# Patient Record
Sex: Female | Born: 1980 | Race: Black or African American | Hispanic: No | Marital: Married | Smoking: Never smoker
Health system: Southern US, Community
[De-identification: ages and names within clinical notes are randomized; demographics above are authoritative.]

## PROBLEM LIST (undated history)

## (undated) DIAGNOSIS — D649 Anemia, unspecified: Secondary | ICD-10-CM

## (undated) HISTORY — PX: TUBAL LIGATION: SHX77

---

## 2006-04-03 ENCOUNTER — Emergency Department (HOSPITAL_COMMUNITY): Admission: EM | Admit: 2006-04-03 | Discharge: 2006-04-04 | Payer: Self-pay | Admitting: Emergency Medicine

## 2007-04-23 ENCOUNTER — Emergency Department (HOSPITAL_COMMUNITY): Admission: EM | Admit: 2007-04-23 | Discharge: 2007-04-23 | Payer: Self-pay | Admitting: Emergency Medicine

## 2007-06-25 ENCOUNTER — Emergency Department (HOSPITAL_COMMUNITY): Admission: EM | Admit: 2007-06-25 | Discharge: 2007-06-25 | Payer: Self-pay | Admitting: Emergency Medicine

## 2007-07-13 ENCOUNTER — Ambulatory Visit (HOSPITAL_COMMUNITY): Admission: RE | Admit: 2007-07-13 | Discharge: 2007-07-13 | Payer: Self-pay | Admitting: Obstetrics & Gynecology

## 2007-07-21 ENCOUNTER — Ambulatory Visit (HOSPITAL_COMMUNITY): Admission: RE | Admit: 2007-07-21 | Discharge: 2007-07-21 | Payer: Self-pay | Admitting: Obstetrics & Gynecology

## 2007-08-07 ENCOUNTER — Ambulatory Visit: Payer: Self-pay | Admitting: *Deleted

## 2007-08-07 ENCOUNTER — Inpatient Hospital Stay (HOSPITAL_COMMUNITY): Admission: AD | Admit: 2007-08-07 | Discharge: 2007-08-07 | Payer: Self-pay | Admitting: Obstetrics & Gynecology

## 2007-08-11 ENCOUNTER — Ambulatory Visit (HOSPITAL_COMMUNITY): Admission: RE | Admit: 2007-08-11 | Discharge: 2007-08-11 | Payer: Self-pay | Admitting: Family Medicine

## 2007-08-28 ENCOUNTER — Inpatient Hospital Stay (HOSPITAL_COMMUNITY): Admission: AD | Admit: 2007-08-28 | Discharge: 2007-08-28 | Payer: Self-pay | Admitting: Obstetrics & Gynecology

## 2007-08-28 ENCOUNTER — Ambulatory Visit: Payer: Self-pay | Admitting: *Deleted

## 2007-10-13 ENCOUNTER — Ambulatory Visit (HOSPITAL_COMMUNITY): Admission: RE | Admit: 2007-10-13 | Discharge: 2007-10-13 | Payer: Self-pay | Admitting: Obstetrics & Gynecology

## 2007-10-26 ENCOUNTER — Inpatient Hospital Stay (HOSPITAL_COMMUNITY): Admission: AD | Admit: 2007-10-26 | Discharge: 2007-10-26 | Payer: Self-pay | Admitting: Obstetrics & Gynecology

## 2007-10-26 ENCOUNTER — Ambulatory Visit: Payer: Self-pay | Admitting: Obstetrics & Gynecology

## 2007-11-07 ENCOUNTER — Ambulatory Visit: Payer: Self-pay | Admitting: Obstetrics and Gynecology

## 2007-11-07 ENCOUNTER — Inpatient Hospital Stay (HOSPITAL_COMMUNITY): Admission: AD | Admit: 2007-11-07 | Discharge: 2007-11-07 | Payer: Self-pay | Admitting: Obstetrics & Gynecology

## 2007-11-24 ENCOUNTER — Ambulatory Visit (HOSPITAL_COMMUNITY): Admission: RE | Admit: 2007-11-24 | Discharge: 2007-11-24 | Payer: Self-pay | Admitting: Family Medicine

## 2007-12-07 ENCOUNTER — Inpatient Hospital Stay (HOSPITAL_COMMUNITY): Admission: RE | Admit: 2007-12-07 | Discharge: 2007-12-09 | Payer: Self-pay | Admitting: Family Medicine

## 2008-01-12 ENCOUNTER — Inpatient Hospital Stay (HOSPITAL_COMMUNITY): Admission: AD | Admit: 2008-01-12 | Discharge: 2008-01-12 | Payer: Self-pay | Admitting: Obstetrics & Gynecology

## 2008-02-14 ENCOUNTER — Inpatient Hospital Stay (HOSPITAL_COMMUNITY): Admission: AD | Admit: 2008-02-14 | Discharge: 2008-02-14 | Payer: Self-pay | Admitting: Obstetrics and Gynecology

## 2008-03-06 IMAGING — US US OB FOLLOW-UP
1 series · 14 of 28 positions shown · non-contrast
Comparison: none

OBSTETRICAL ULTRASOUND:
 This ultrasound was performed in The [HOSPITAL], and the [facility] report will be stored to [REDACTED] PACS.

[Series 1: us ob follow-up · 44 acquisitions, 14 frames shown]
[im 2/44]
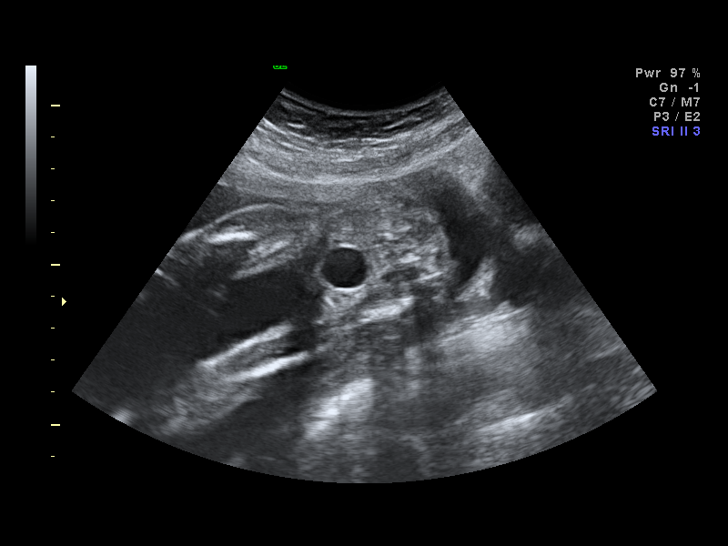
[im 5/44]
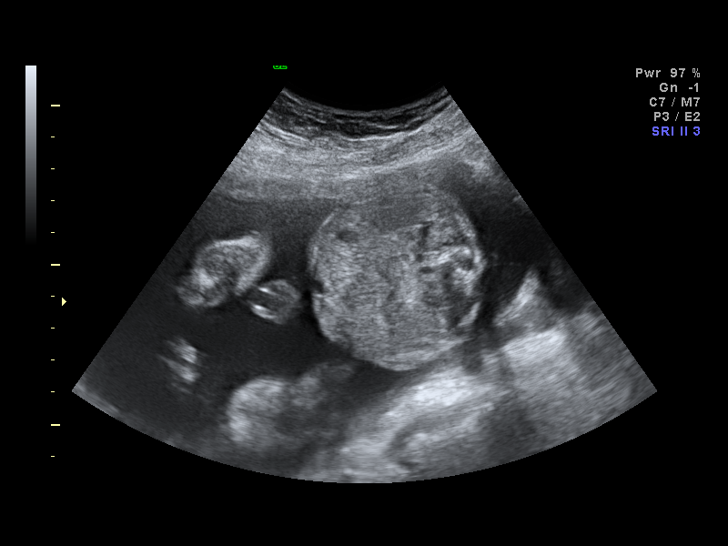
[im 8/44]
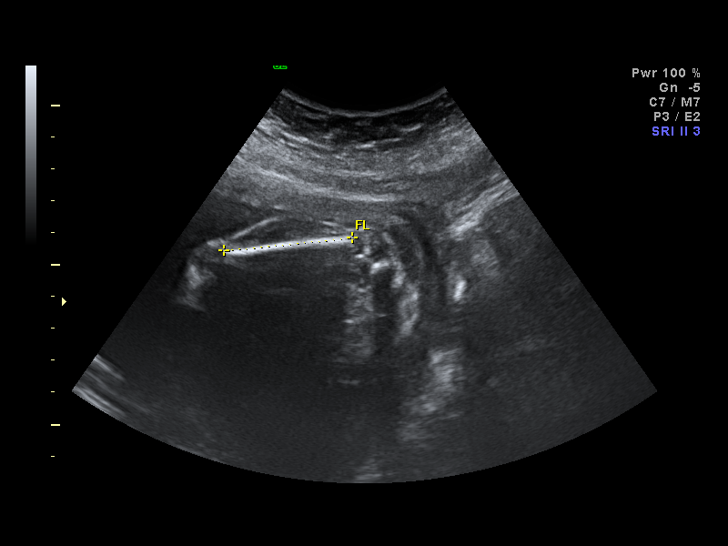
[im 12/44]
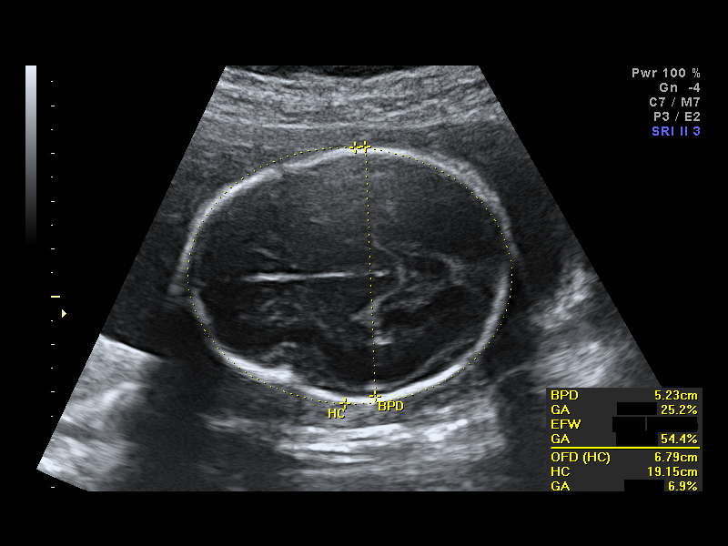
[im 15/44]
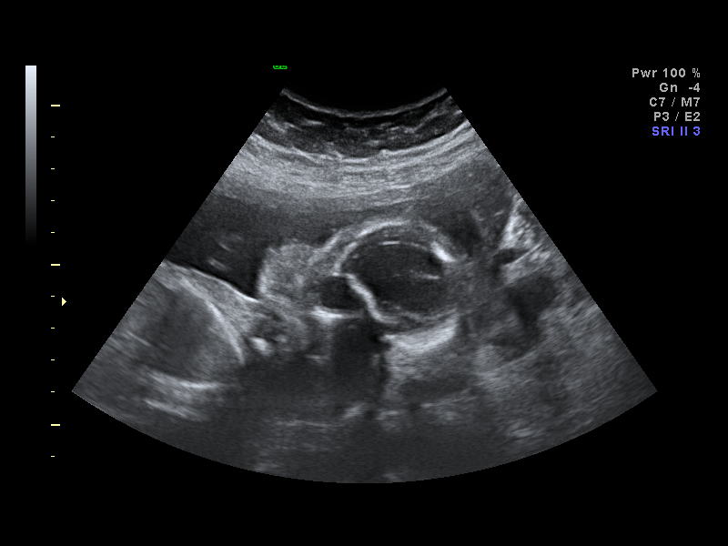
[im 18/44]
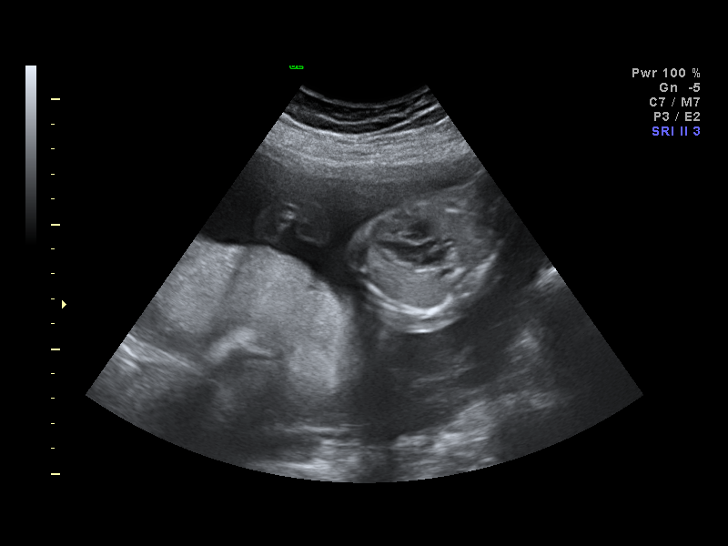
[im 21/44]
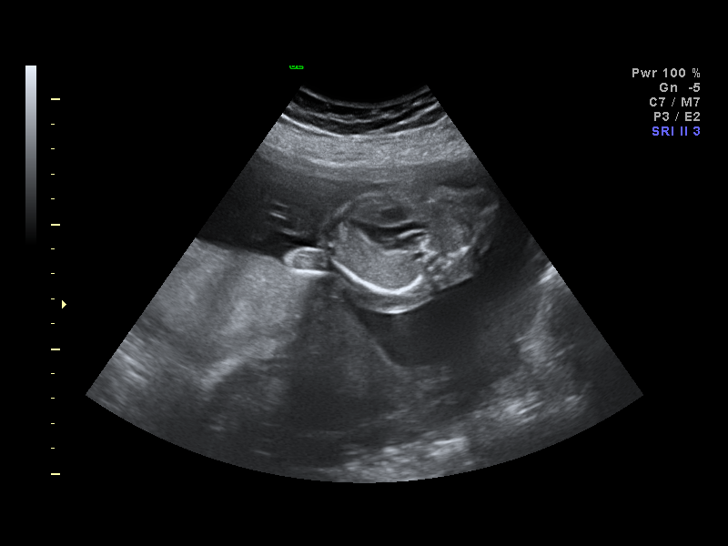
[im 24/44]
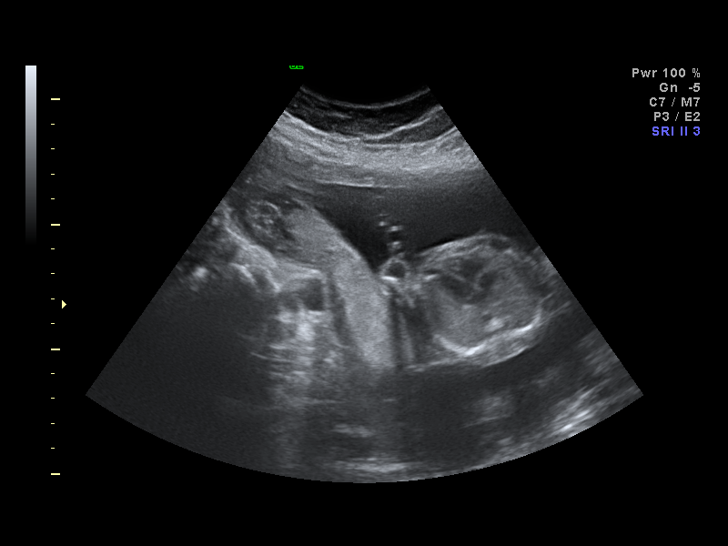
[im 28/44]
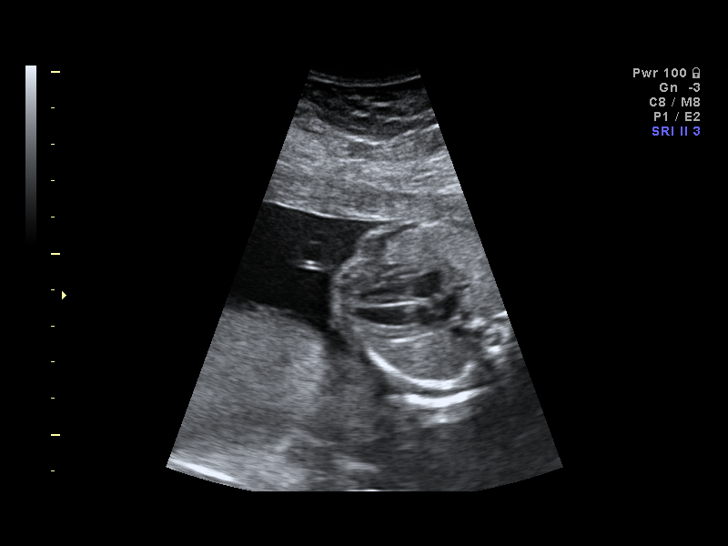
[im 31/44]
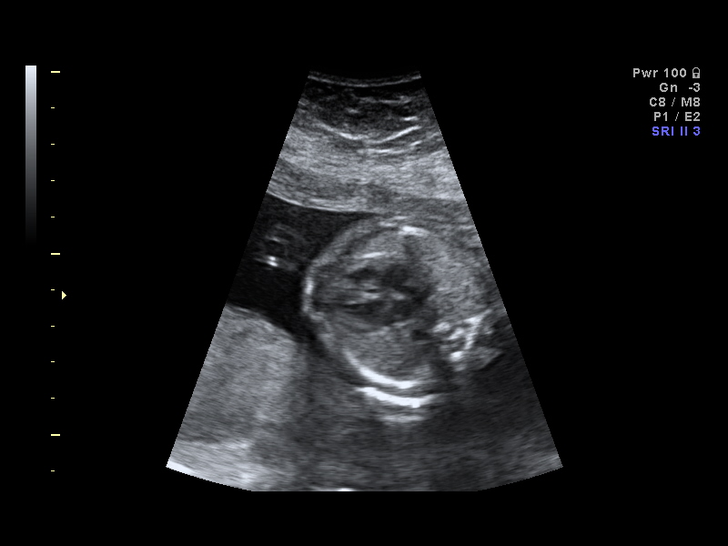
[im 34/44]
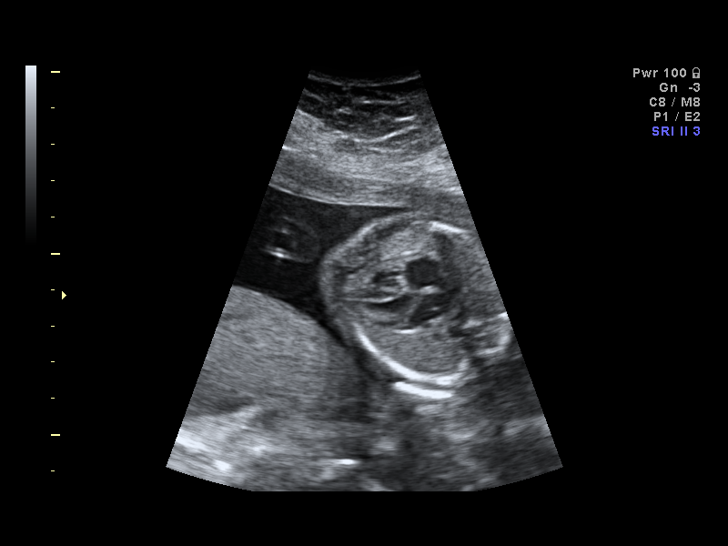
[im 37/44]
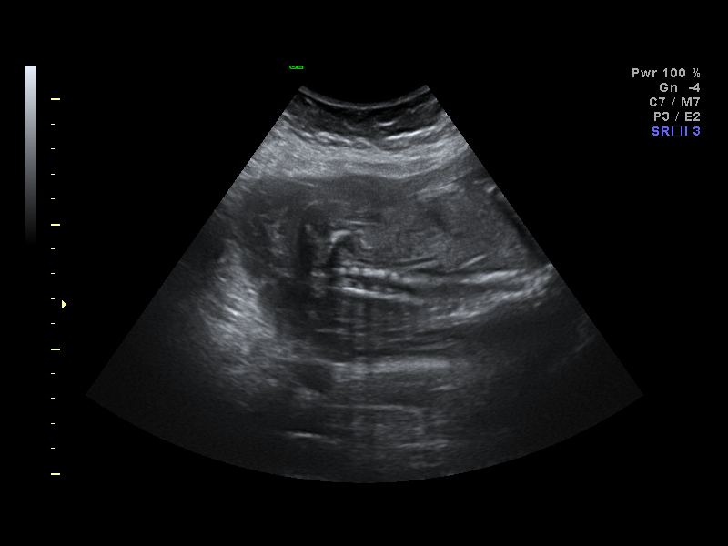
[im 40/44]
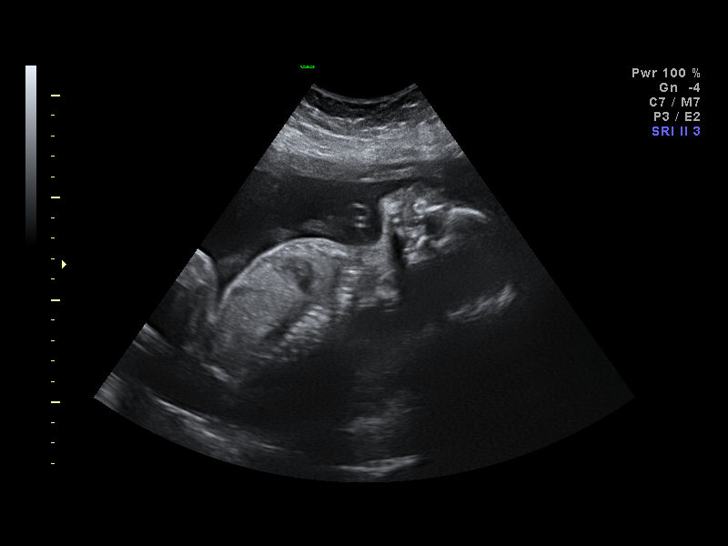
[im 44/44]
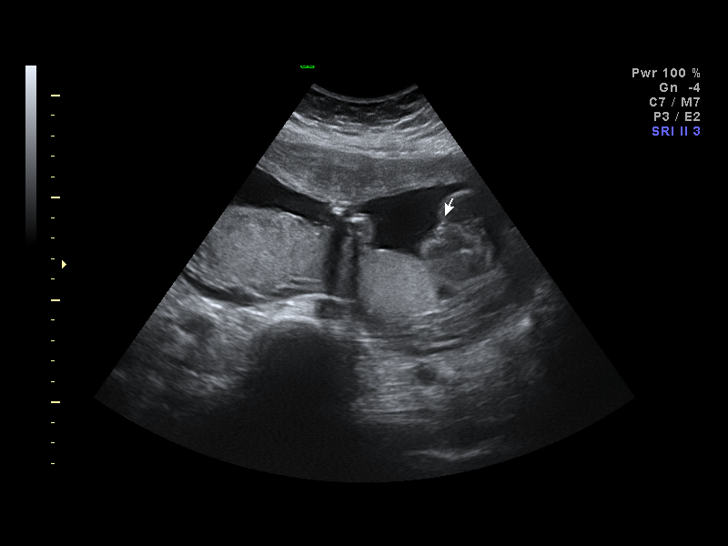

[14 of 28 positions shown; findings below may reference images not displayed]

IMPRESSION: [facility] has also been faxed to the ordering physician.

## 2009-05-17 ENCOUNTER — Ambulatory Visit: Payer: Self-pay | Admitting: Internal Medicine

## 2009-05-17 ENCOUNTER — Inpatient Hospital Stay (HOSPITAL_COMMUNITY)
Admission: EM | Admit: 2009-05-17 | Discharge: 2009-05-19 | Payer: Self-pay | Admitting: Blood Banking & Transfusion Medicine

## 2009-05-18 ENCOUNTER — Ambulatory Visit: Payer: Self-pay | Admitting: Oncology

## 2009-05-19 ENCOUNTER — Encounter (INDEPENDENT_AMBULATORY_CARE_PROVIDER_SITE_OTHER): Payer: Self-pay | Admitting: Internal Medicine

## 2009-05-19 ENCOUNTER — Ambulatory Visit: Payer: Self-pay | Admitting: Oncology

## 2009-06-05 LAB — COMPREHENSIVE METABOLIC PANEL
Albumin: 3.8 g/dL (ref 3.5–5.2)
BUN: 6 mg/dL (ref 6–23)
CO2: 25 mEq/L (ref 19–32)
Calcium: 8.7 mg/dL (ref 8.4–10.5)
Chloride: 104 mEq/L (ref 96–112)
Glucose, Bld: 99 mg/dL (ref 70–99)
Potassium: 3.9 mEq/L (ref 3.5–5.3)
Sodium: 137 mEq/L (ref 135–145)
Total Protein: 7.2 g/dL (ref 6.0–8.3)

## 2009-06-05 LAB — CBC WITH DIFFERENTIAL/PLATELET
Basophils Absolute: 0 10*3/uL (ref 0.0–0.1)
Eosinophils Absolute: 0.2 10*3/uL (ref 0.0–0.5)
HGB: 8 g/dL — ABNORMAL LOW (ref 11.6–15.9)
MONO#: 0.5 10*3/uL (ref 0.1–0.9)
NEUT#: 4.4 10*3/uL (ref 1.5–6.5)
RBC: 3.77 10*6/uL (ref 3.70–5.45)
RDW: 35.6 % — ABNORMAL HIGH (ref 11.2–14.5)
WBC: 6.4 10*3/uL (ref 3.9–10.3)

## 2009-06-05 LAB — IRON AND TIBC: %SAT: 5 % — ABNORMAL LOW (ref 20–55)

## 2009-06-05 LAB — LACTATE DEHYDROGENASE: LDH: 155 U/L (ref 94–250)

## 2009-06-05 LAB — FERRITIN: Ferritin: 14 ng/mL (ref 10–291)

## 2009-12-12 IMAGING — CT CT HEAD W/ CM
1 of 2 series · 13 of 30 positions shown, 17 images · IV contrast (agent unspecified)
Comparison: None

CLINICAL DATA: Anemia, weakness

CT HEAD WITHOUT AND WITH CONTRAST
TECHNIQUE: Contiguous axial images were obtained from the base of
the skull through the vertex with intravenous contrast
Contrast: 100 ml 8mnipaque-P11 IV

[Series 2: head_seq 4.5 h37s st · axial · 0.43mm/px · z∈[-129,+6]mm · 13 of 36 slices shown, 17 images]
[im 3/36  brain]
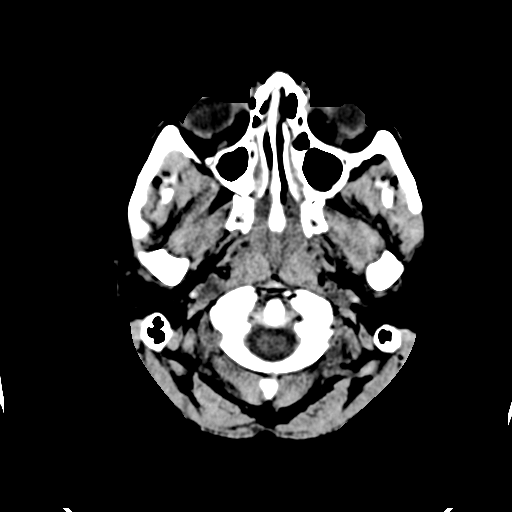
[im 3/36  bone]
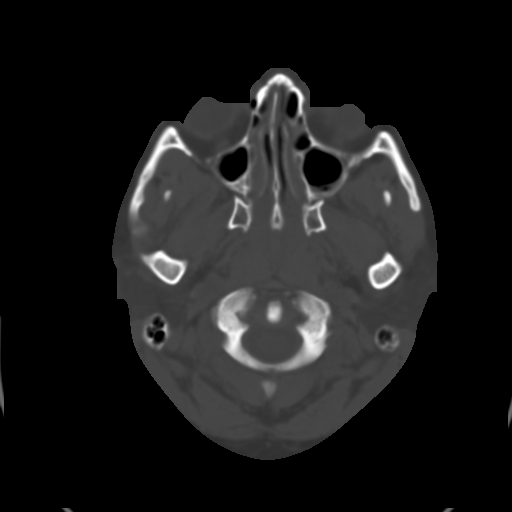
[im 6/36  brain]
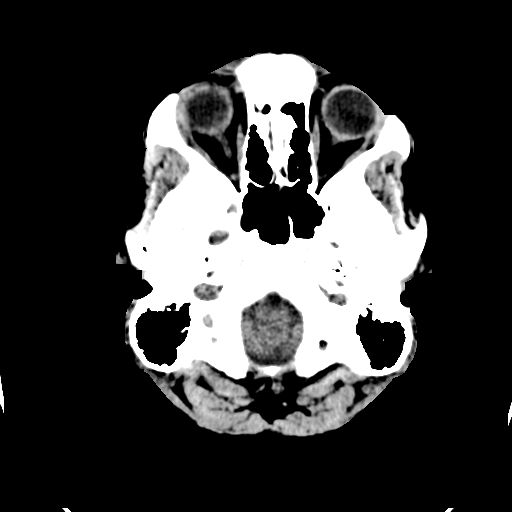
[im 8/36  brain]
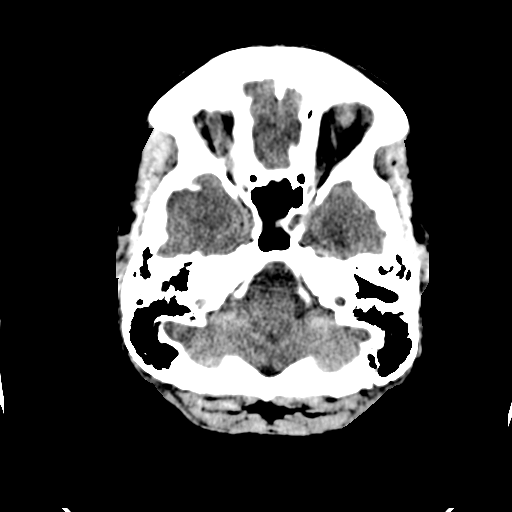
[im 11/36  brain]
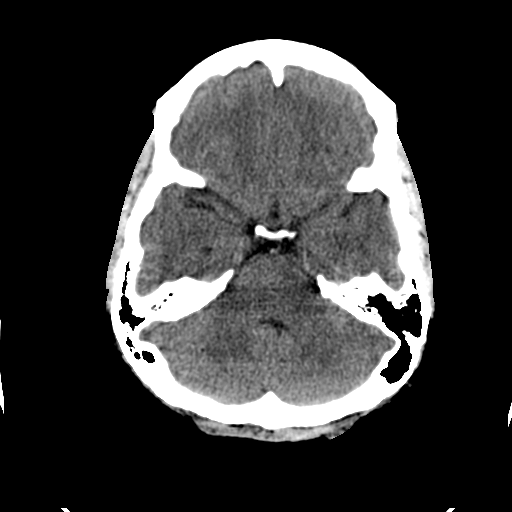
[im 13/36  brain]
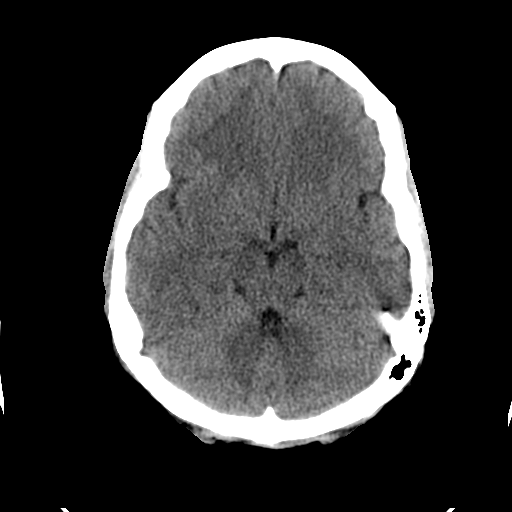
[im 13/36  bone]
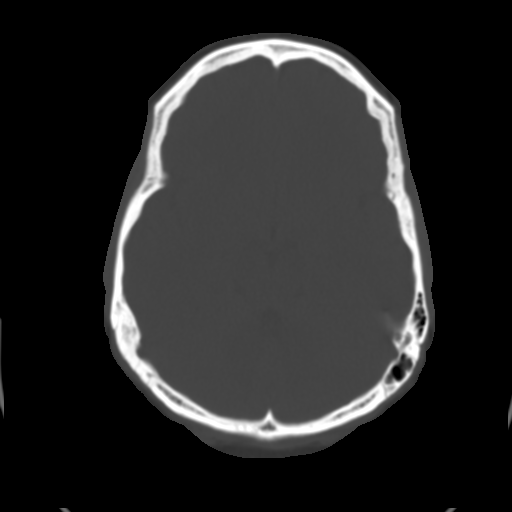
[im 16/36  brain]
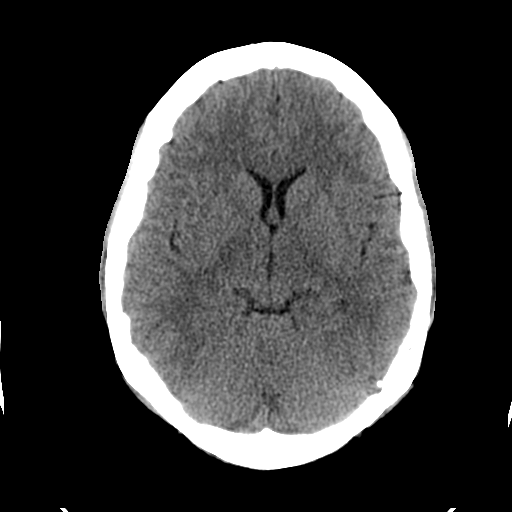
[im 18/36  brain]
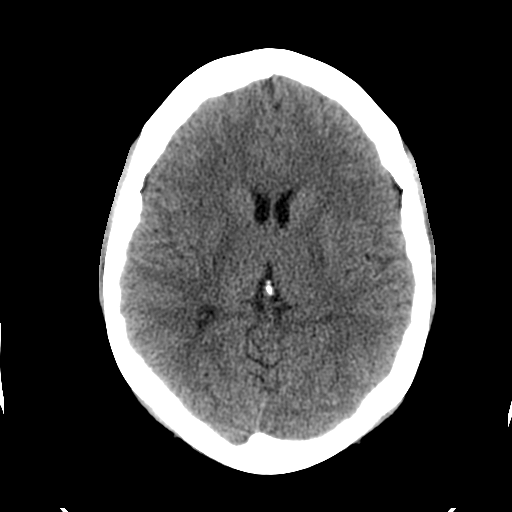
[im 21/36  brain]
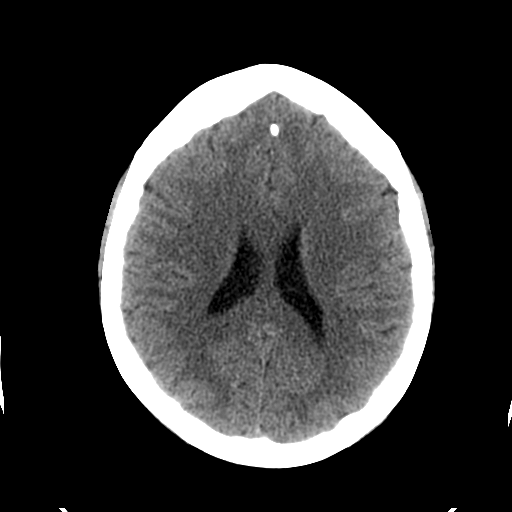
[im 23/36  brain]
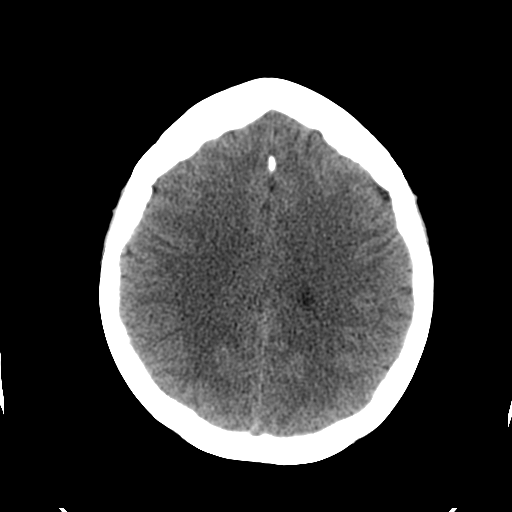
[im 23/36  bone]
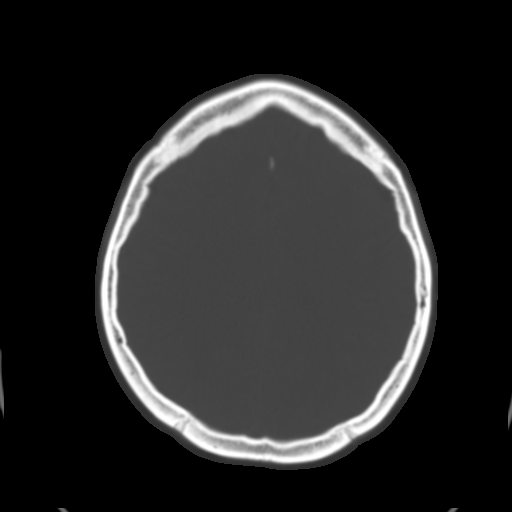
[im 26/36  brain]
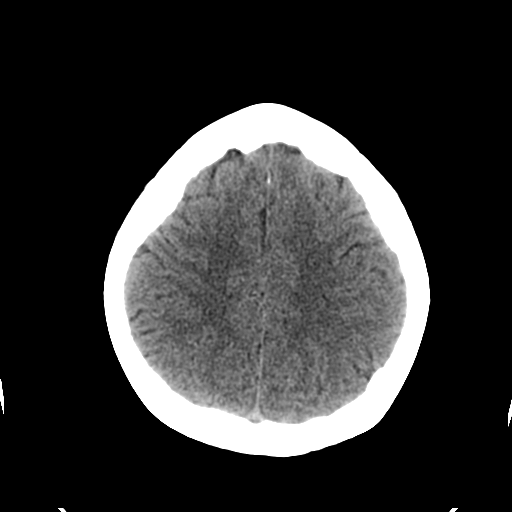
[im 28/36  brain]
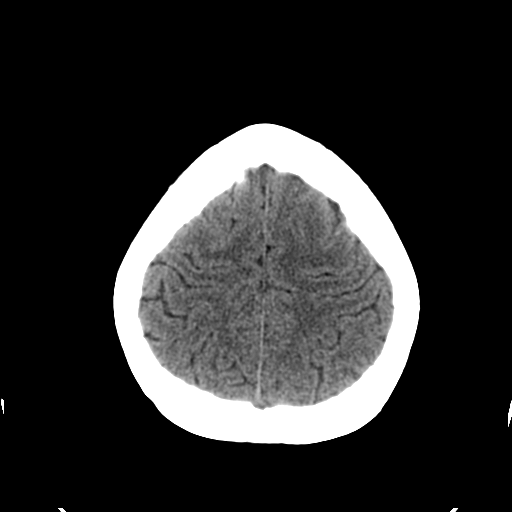
[im 31/36  brain]
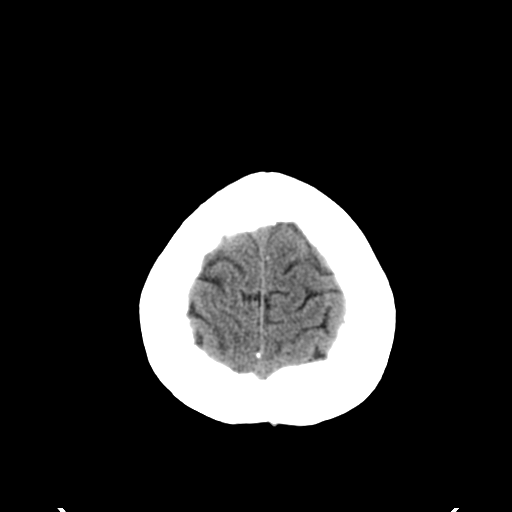
[im 33/36  brain]
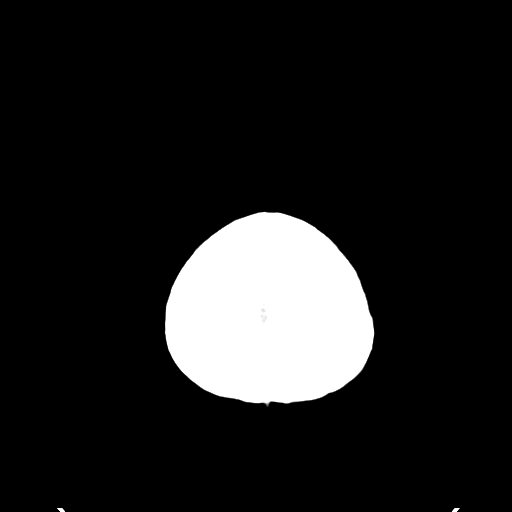
[im 33/36  bone]
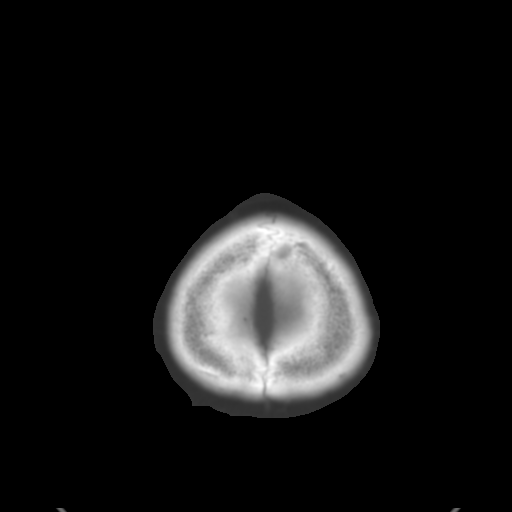

[13 of 30 positions shown; findings below may reference images not displayed]

FINDINGS: There is no evidence of acute intracranial hemorrhage,
brain edema, mass lesion, acute infarction,   mass effect, or
midline shift. No areas of abnormal enhancement after IV contrast
administration.  No other intra-axial abnormalities are seen, and
the ventricles and sulci are within normal limits in size and
symmetry.   No abnormal extra-axial fluid collections or masses are
identified.  No significant calvarial abnormality. There is minimal
mucoperiosteal thickening in the visualized left maxillary sinus.
IMPRESSION: 1. Negative for bleed or other acute intracranial process.
2.  Negative for mass or other lesion.

## 2010-12-21 LAB — COMPREHENSIVE METABOLIC PANEL
ALT: 13 U/L (ref 0–35)
AST: 21 U/L (ref 0–37)
CO2: 26 mEq/L (ref 19–32)
Calcium: 8.5 mg/dL (ref 8.4–10.5)
Creatinine, Ser: 0.79 mg/dL (ref 0.4–1.2)
GFR calc Af Amer: >60 mL/min (ref >60)
GFR calc non Af Amer: >60 mL/min (ref >60)
Sodium: 135 mEq/L (ref 135–145)
Total Protein: 7.3 g/dL (ref 6.0–8.3)

## 2010-12-21 LAB — DIFFERENTIAL
Basophils Absolute: 0.1 10*3/uL (ref 0.0–0.1)
Eosinophils Relative: 3 % (ref 0–5)
Lymphocytes Relative: 14 % (ref 12–46)
Monocytes Absolute: 0.7 10*3/uL (ref 0.1–1.0)
Neutro Abs: 5.7 10*3/uL (ref 1.7–7.7)
Neutrophils Relative %: 72 % (ref 43–77)

## 2010-12-21 LAB — HEPATIC FUNCTION PANEL
ALT: 14 U/L (ref 0–35)
AST: 19 U/L (ref 0–37)
Albumin: 3.4 g/dL — ABNORMAL LOW (ref 3.5–5.2)
Alkaline Phosphatase: 69 U/L (ref 39–117)
Bilirubin, Direct: 0.1 mg/dL (ref 0.0–0.3)
Total Bilirubin: 0.6 mg/dL (ref 0.3–1.2)

## 2010-12-21 LAB — LEAD, BLOOD: Lead-Whole Blood: <1 (ref <10)

## 2010-12-21 LAB — BASIC METABOLIC PANEL
BUN: 5 mg/dL — ABNORMAL LOW (ref 6–23)
CO2: 26 mEq/L (ref 19–32)
Chloride: 105 mEq/L (ref 96–112)
GFR calc Af Amer: >60 mL/min (ref >60)
GFR calc non Af Amer: >60 mL/min (ref >60)
Glucose, Bld: 86 mg/dL (ref 70–99)
Potassium: 4.2 mEq/L (ref 3.5–5.1)

## 2010-12-21 LAB — IRON AND TIBC: TIBC: 430 ug/dL (ref 250–470)

## 2010-12-21 LAB — POCT I-STAT, CHEM 8
Chloride: 101 mEq/L (ref 96–112)
Creatinine, Ser: 0.6 mg/dL (ref 0.4–1.2)
HCT: 25 % — ABNORMAL LOW (ref 36.0–46.0)
Hemoglobin: 8.5 g/dL — ABNORMAL LOW (ref 12.0–15.0)
Potassium: 3.6 mEq/L (ref 3.5–5.1)
Sodium: 138 mEq/L (ref 135–145)

## 2010-12-21 LAB — VON WILLEBRAND PANEL
Factor-VIII Activity: 169 % — ABNORMAL HIGH (ref 50–150)
Von Willebrand Ag: 174 % normal — ABNORMAL HIGH (ref 61–164)

## 2010-12-21 LAB — CBC
MCHC: 28.5 g/dL — ABNORMAL LOW (ref 30.0–36.0)
MCHC: 29.9 g/dL — ABNORMAL LOW (ref 30.0–36.0)
MCV: 57.3 fL — ABNORMAL LOW (ref 78.0–100.0)
MCV: 65.8 fL — ABNORMAL LOW (ref 78.0–100.0)
Platelets: 289 10*3/uL (ref 150–400)
RBC: 4.2 MIL/uL (ref 3.87–5.11)
RDW: 20.9 % — ABNORMAL HIGH (ref 11.5–15.5)
RDW: 30.5 % — ABNORMAL HIGH (ref 11.5–15.5)
RDW: 30.6 % — ABNORMAL HIGH (ref 11.5–15.5)

## 2010-12-21 LAB — APTT: aPTT: 32 seconds (ref 24–37)

## 2010-12-21 LAB — SICKLE CELL SCREEN: Sickle Cell Screen: NEGATIVE

## 2010-12-21 LAB — HAPTOGLOBIN: Haptoglobin: 201 mg/dL — ABNORMAL HIGH (ref 16–200)

## 2010-12-21 LAB — SAVE SMEAR

## 2010-12-21 LAB — RETICULOCYTES
RBC.: 4 MIL/uL (ref 3.87–5.11)
Retic Count, Absolute: 48 10*3/uL (ref 19.0–186.0)

## 2010-12-21 LAB — VON WILLEBRAND FACTOR MULTIMER
Factor-VIII Activity: 139 % (ref 50–180)
Von Willebrand Factor Ag: 164 % (ref 50–217)

## 2010-12-21 LAB — FERRITIN: Ferritin: 3 ng/mL — ABNORMAL LOW (ref 10–291)

## 2010-12-21 LAB — CROSSMATCH

## 2010-12-21 LAB — FOLATE: Folate: 8.3 ng/mL

## 2010-12-21 LAB — LACTATE DEHYDROGENASE: LDH: 144 U/L (ref 94–250)

## 2010-12-21 LAB — HEMOGLOBINOPATHY EVALUATION
Hgb A: 97.3 % (ref 96.8–97.8)
Hgb F Quant: 0.6 % — ABNORMAL HIGH (ref 0.0–2.0)

## 2011-01-29 NOTE — H&P (Signed)
NAME:  [name] [name], [name] [name] NO.:  [id number]   MEDICAL RECORD NO.:  [id number]          PATIENT TYPE:  EMS   LOCATION:  ED                           FACILITY:  [facility]   PHYSICIAN:  Conley Canal, MD      DATE OF BIRTH:  [birth year]   DATE OF ADMISSION:  05/17/2009  DATE OF DISCHARGE:                              HISTORY & PHYSICAL   HISTORY OF PRESENT ILLNESS:  This middle aged female presents to the  Emergency Room today following 24 to 48 hours of musculoskeletal  weakness.  She had an incident of presyncope today at work.  She states  she has previously been anemic but has no primary care physician and  this has not been worked up previously.  She was found to have a low  hemoglobin in the Emergency Room at 6.0.  She states there is sickle  cell trait on her father's side with several of her brothers and sisters  having sickle cell trait although no active sickle cell anemia herself.  She is admitted for anemia workup including sickle cell and transfusion  of packed red blood cells.   PAST MEDICAL HISTORY:  1. Anemia, rule out sickle cell.  2. C-section x 2 with tubal ligation.   PAST SURGICAL HISTORY:  As above.   HOME MEDICATIONS:  None.   ALLERGIES:  No known drug allergies.   FAMILY HISTORY:  Father is alive with no significant medical problems.  He does carry sickle cell trait.  Mother living with a history of  uterine fibroid tumor, status post hysterectomy.  There are four sisters  with some carrying sickle cell trait but no active sickle cell disease.  There are two brothers, one carries the sickle cell trait but not sickle  cell disease.   SOCIAL HISTORY:  The patient is married with two children.  She works at  [employer].  There is no history of alcohol or tobacco or illicit drugs.   REVIEW OF SYSTEMS:  GENERAL:  She complains of weakness and presyncope  at work today.  HEENT:  Head - she does not believe she struck her head  during the presyncopal  episode.  Eyes - she has no visual deficit.  No  eye pain or drainage.  Ears - no ear pain or drainage.  Hearing acuity  described normal.  Nose - no rhinitis or history of sinusitis.  No nasal  drainage.  Throat - no history of pharyngitis, thyroid disorder or  adenopathy.  CARDIOVASCULAR:  No history of coronary artery disease or  angina.  No history of arrhythmia.  No history of hypertension.  LUNGS:  No history of shortness of breath or cough.  No sputum.  No history of  COPD or asthma.  ABDOMEN:  No nausea or vomiting.  No diarrhea or  constipation.  No abdominal pain.  UROGENITAL:  No dysuria.  She states  she has occasional incontinence, most likely stress type from her  description.  She has had prior C-section x 2.  Tubal ligation.  MUSCULOSKELETAL:  She describes herself as weak although able to  ambulate.  NEUROLOGIC:  Presyncope today at work.  She states she did  not completely pass out, only felt dizzy and felt down.  No history of  CVA.  HEMATOLOGIC:  As above.  She states she has not been worked up for  sickle cell.  She states she has previously been anemic but I do not  believe she has been transfused.  She states she has no known blood  disorders.  PSYCHIATRIC:  No depression or anxiety.  IMMUNOLOGIC:  No  immunosuppressant drugs.  No recurrent or frequent infections.   PHYSICAL EXAMINATION:  VITAL SIGNS:  Temperature 98.5, pulse 88,  respiratory rate 20, blood pressure 128/49, O2 saturation 100% on room  air.  GENERAL APPEARANCE:  This is a mildly obese female in no acute distress.  She is a well developed female patient.  SKIN:  Turgor is adequate.  There are no lesions, masses or ulcers  noted.  HEENT:  Head normocephalic.  Eyes - PERLA.  Ears - canals clear.  Normal  hearing acuity per conversational tone.  Nose - patent without masses or  discharge noted.  No sinus pain.  Mouth - oral mucosa pink and moist.  NECK:  Supple with no adenopathy or thyromegaly  noted.  CARDIOVASCULAR:  Regular rate and rhythm.  No murmurs, rubs or clicks.  LUNGS:  Clear and equal bilaterally.  O2 saturation stable on room air.  ABDOMEN:  Soft and mildly obese with positive bowel sounds.  Old C-  section scar.  No pain, guarding, rebound tenderness.  UROGENITAL:  Genital exam deferred.  There is no bladder pain or  fullness noted.  No CVA  tenderness.  MUSCULOSKELETAL:  Range of motion and strength 5/5 and equal in all four  extremities. No sign of osteoarthritis with rheumatoid arthritis  changes.  NEUROLOGIC:  Cranial nerves II through XII grossly intact.  She is alert  and oriented.  I deferred deep tendon reflexes.  RECTAL:  Deferred for now.  I will order stool occult blood x 3 when she  is placed on the floor.  PSYCHIATRIC:  Affect pleasant.  No indication of depression or anxiety.  HEMATOLOGIC:  No sign of active bleeding.   LABORATORY DATA:  CBC - WBC 8.0, hemoglobin low at 6.0, hematocrit 21.2,  platelets 289.  Differential unremarkable.  ISTAT metabolic  values -  sodium 138, potassium 3.6, chloride 101, CO2 24, BUN 8, creatinine 0.6,  glucose 101.   IMPRESSION/ PLAN:  1. Transfusional anemia.  Will type and cross for 2 units packed red      blood cells and transfuse each unit over 4 hours.  Anemia panel      prior to transfusion.  Will also obtain a sickle cell  peripheral      screen and reticulocyte count prior to transfusion.  Will repeat a      CBC and basic metabolic profile post transfusion packed red blood      cells.  2. No primary care physician.  Case manage to review for primary care      possible [facility] candidate.  3. DVT prophylaxis.  Given the etiology of the current transfusable      anemia unclear, I will defer Lovenox or heparin.  Instead will use      sequential compression devices and TED hose.      Everett Graff, N.P.      Conley Canal, MD  Electronically Signed    TC/MEDQ  D:  05/17/2009  T:  05/17/2009   Job:  [id number]

## 2011-01-29 NOTE — Op Note (Signed)
[name], TRIM            ACCOUNT NO.:  [id number]   MEDICAL RECORD NO.:  [id number]          PATIENT TYPE:  INP   LOCATION:  9123                          FACILITY:  [facility]   PHYSICIAN:  Tanya S. Shawnie Pons, M.D.   DATE OF BIRTH:  05-05-1981   DATE OF PROCEDURE:  12/07/2007  DATE OF DISCHARGE:                               OPERATIVE REPORT   PREOPERATIVE DIAGNOSES:  1. Intrauterine pregnancy at 39 weeks.  2. History of previous cesarean section x1.  3. Desired sterilization.   POSTOPERATIVE DIAGNOSES:  1. Intrauterine pregnancy at 39 weeks.  2. History of previous cesarean section x1.  3. Desired sterilization.   PROCEDURE:  1. Repeat low transverse cesarean section.  2. Bilateral tubal occlusion with Filshie clips.   SURGEON:  Shelbie Proctor. Shawnie Pons, MD.   ASSISTANT:  Karlton Lemon, MD.   ANESTHESIA:  Spinal.   FINDINGS:  1. A viable infant female weighing 7 pounds 6 ounces with Apgar's 9 at      one minute and 9 at five minutes in a vertex presentation.  2. Clear amniotic fluid.  3. Normal female pelvic anatomy.   ESTIMATED BLOOD LOSS:  1000 ml.   DRAINS:  Foley with clear yellow urine.   COMPLICATIONS:  None immediate.   SPECIMENS:  1. The placenta to Labor and Delivery.  2. Cord blood to the Cord Bank.   INDICATION FOR PROCEDURE:  Ms. [name] [name] is a 30 year old, gravida 2,  para 1-0-0-1, at [redacted] weeks gestation presenting for a  repeat low  transverse cesarean section.  She has a history of previous C-section x1  and elected to have a repeat cesarean section.  She also desires  permanent sterilization.  The patient was counseled once again that the  bilateral tubal occlusion is considered permanent.  Patient wishes to  proceed with the procedure.   DESCRIPTION OF THE PROCEDURE:  The patient was taken to the operating  room and after obtaining adequate spinal anesthesia was prepped and  draped in the usual sterile manner in the supine position with left  lateral  uterine displacement.  After ensuring adequate anesthesia, a  Pfannenstiel skin incision was made using the scalpel.  The incision was  carried down through the subcutaneous tissues using the scalpel.  The  rectus fascia was nicked in the midline and the incision was extended  laterally in each direction using the Mayo scissors.  The rectus muscles  were dissected free of the fascia using both sharp and blunt dissection.  The rectus muscles were separated bluntly.  The parietal peritoneum was  separated with the rectus muscles and separated bluntly.  A bladder  blade was placed and a low transverse uterine incision was made using  the scalpel.  The incision was then extended laterally and superiorly  using blunt dissection.  The amniotic membranes were then ruptured with  pickups with teeth.  A hand was placed within the uterine cavity and  used to elevated and flex the head of the infant, which was delivered  without difficulty.  The infant was bulb suctioned after delivery of the  head.  The  body of the infant was then delivered without difficulty.  The cord was doubly clamped and cut and the infant handed to the [employer] in attendance.  Specimens were collected for cord blood.  The  placenta was delivered with uterine massage and cord traction and  appeared intact.  The endometrial cavity was then wiped free of any  trace of membranes using wet laparotomy sponges.  The edges of the  uterine incision were grasped with ring clamps and the uterine incision  was closed in one layer of a running locking stitch of #0 Vicryl.  Attention was then turned to the left tube, which was clamped with two  Babcock clamps.  The Filshie clip was placed between the Babcock clamps  under direct visualization.  Prior to placing the Filshie clip, the tube  was identified out to the fimbria.  After placement of the Filshie clip,  the Babcock clamps were released and the tube was allowed to fall back   within the abdominal cavity.  Attention was then turned to the right  tube, which was again easily identified and grasped with two Babcock  clamps.  It was followed out to the fimbria, which was identified  easily.  A Filshie clip was placed between the two Babcock clamps at the  isthmus-ampullary junction.  The clip was placed under direct  visualization and was in good position.  The Babcock clamps were then  released and the tube was allowed to fall back within the abdominal  cavity.  Attention was turned back to the uterine incision, which was  inspected for hemostasis, which was found to be under good control.  The  rectus muscles were inspected and found to have good hemostasis.  The  uterine incision was inspected once more and found to have good  hemostasis.  The rectus fascia was reapproximated with one suture of #0  Vicryl in a running and locking fashion.  Small areas of bleeding in the  subcutaneous tissue were controlled using the Bovie cautery.  The skin  was reapproximated with stainless steel skin staples.  Sponge, needle,  and instrument counts were correct x2.  The patient tolerated the  procedure well and went to the postanesthesia care unit in stable  condition.      Karlton Lemon, MD  Electronically Signed     ______________________________  Shelbie Proctor. Shawnie Pons, M.D.    NS/MEDQ  D:  12/07/2007  T:  12/07/2007  Job:  [id number]

## 2011-01-29 NOTE — Discharge Summary (Signed)
[name], [name]            ACCOUNT NO.:  [id number]   MEDICAL RECORD NO.:  [id number]          PATIENT TYPE:  OUT   LOCATION:  ULT                           FACILITY:  [facility]   PHYSICIAN:  Tanya S. Shawnie Pons, M.D.   DATE OF BIRTH:  08/29/1981   DATE OF ADMISSION:  11/24/2007  DATE OF DISCHARGE:  12/09/2007                               DISCHARGE SUMMARY   REASON FOR ADMISSION:  Repeat low-transverse cesarean section.   HISTORY OF PRESENT ILLNESS:  The patient is 30 year old G2, P1 admitted  for repeat low-transverse cesarean section.  She went on to have a  viable female infant with Apgars of 9 at 1 minute and 9 at 5 minutes  under spinal anesthesia.  Please see operative report for details.  The  3-vessel code and spontaneous placenta were retrieved.  A bilateral  tubal ligation was performed.  Estimated blood loss 1000 mL.  The  patient is breast and bottle feeding, and she desires early discharge.  On postoperative day 2, prenatal labs include blood type AB positive,  rubella immune, GBS positive, HIV negative, RPR negative, and hepatitis  B surface antigen negative.   ANTEPARTUM PROCEDURES:  Low-cervical transverse cesarean.   INTRAOPERATIVE PROCEDURES:  Postpartal tubal ligation.   COMPLICATIONS:  None.   DISCHARGE DIAGNOSES:  Term pregnancy, delivered.   DISCHARGE DATE:  December 09, 2007.   DISCHARGE ACTIVITIES:  Restricted.  The patient is instructed to have no  intercourse for 6 weeks.  No lifting more than 10 pounds for 6 weeks.  No driving for 2 weeks.   DIET:  Routine.   MEDICATIONS:  Include prenatal vitamins 1 tablet p.o. daily, breast-  feeding, Propocet 5/325 mg 1 tablet q.6 hours p.o. p.r.n. for moderate-  to-severe pain, ibuprofen 600 mg 1 tablet p.o. q.6 hours p.r.n. mild-to-  moderate pain, Colace over-the-counter 100 mg 1 tablet p.o. b.i.d.  p.r.n. constipation, iron 325 mg 1 tablet p.o. b.i.d. for anemia.   DISCHARGE STATUS:  Well.   DISCHARGE  INSTRUCTIONS:  Routine.  The patient is discharged home.  Instructed to follow up in 6 weeks at the health department.  Her  discharge hematocrit is 29.7, discharge hemoglobin 9.7 those were from  December 08, 2007.      Asher Muir, MD      Shelbie Proctor. Shawnie Pons, M.D.  Electronically Signed    SO/MEDQ  D:  12/09/2007  T:  12/10/2007  Job:  [id number]

## 2011-01-29 NOTE — Consult Note (Signed)
[name], [name] NO.:  [id number]   MEDICAL RECORD NO.:  [id number]          PATIENT TYPE:  INP   LOCATION:  1338                         FACILITY:  [facility]   PHYSICIAN:  Samul Dada, M.D.DATE OF BIRTH:  July 14, 1981   DATE OF CONSULTATION:  05/18/2009  DATE OF DISCHARGE:  05/19/2009                                 CONSULTATION   REQUESTING PHYSICIANS:  [facility]   REASON FOR CONSULTATION:  Microcytic anemia.   HISTORY OF PRESENT ILLNESS:  [name] [name] is a pleasant 30 year old woman  with a known history of anemia at least dating back to 2007, admitted to  [facility] on September 1 with presyncopal episode while at  work.  Her hemoglobin and hematocrit at the emergency department were  8.5 and 25 respectively, and 1 hour later, they became 6.6 and at 21.2,  with normal platelet count and white count.  The patient received 2  units of packed RBCs, and today her hemoglobin and hematocrit are 8.3  and 27.7 respectively.  She has had episodes of vaginal bleeding in the  past, and her periods are significantly heavily, less heavy since her  tubes were tied as she says.  Her last menstrual period was on April 12, 2009, for 5 days.  She also has noticed significant amount of clots  during those periods.  Her Hemoccult is pending.  CT of the head on  September 1, with contrast was negative for hemorrhage.  Retic count is  normal at 48.  Other tests including TSH, LDH, haptoglobin and hepatitis  panel are pending at this time.  Her family history has no bleeding  disorders, but there is history of sickle cell trait.  The patient was  started on iron supplement.  She was not on that medicine prior to  admission.  Her smear is available for review.  Of note, the patient  admits to being in significant amount of ice chips.  No significant  amount of starch.   PAST MEDICAL HISTORY:  1. Known anemia, per patient report, at least dating back to  2007.  2. Sickle cell trait surgery.  3. Status post C-section x2, last one in March 2009, with bilateral      tubal ligation.   ALLERGIES:  NKDA.   MEDICATIONS:  1. Folic acid 1 mg daily.  2. Protonix 40 mg q.12 hours.  3. Dulcolax 10 mg p.r.n.  4. Zofran 4 mg q.6 hours p.r.n.  5. Ambien 10 mg at night p.r.n.   The patient was on no medications prior to admission.   REVIEW OF SYSTEMS:  Remarkable for cold intolerance, fatigue, mild  dyspnea on exertion, intermittent headaches, and what she describes as  chest pressure, which was present during presyncopal episode.  The  patient states that she did have a presyncopal episodes dating back to  at least 1 year ago, a total of 6.  She has admitted to caffeine intake,  especially iced tea.  She also takes as mentioned above, significant  amounts of ice chips.  Rest of the review of systems is  negative.   FAMILY HISTORY:  Mother alive with a history of fibroids, status post  hysterectomy.  Father has sickle cell trait.  She has four sisters with  sickle cell trait, and two brothers, one with sickle cell trait.   SOCIAL HISTORY:  The patient is married.  She has 2 children.  She works  at Scientist, product/process development at [employer].  No tobacco or alcohol history.  Lives in  [place].   PHYSICAL EXAMINATION:  This is a well-developed, well-nourished 27-year-  old [place] American female in no acute distress, alert and oriented x3.  Blood pressure 144/86,  pulse 92, respirations 20, temperature 98.2,  pulse oximetry 100% in room air.  Weight 97.8 kg, height 63 inches.  HEENT:  Normocephalic, atraumatic.  PERLA.  Sclerae anicteric.  Oral  cavity without lesions or thrush.  NECK:  Supple.  No cervical or supraclavicular masses.  LUNGS:  Clear to auscultation bilaterally.  No axillary masses.  CARDIOVASCULAR:  Regular rate and rhythm without murmurs, rubs or  gallops.  ABDOMEN:  Soft, nontender.  Bowel sounds x4.  No hepatosplenomegaly.  EXTREMITIES:   No clubbing or cyanosis.  No edema.  No inguinal masses.  SKIN:  Without bruising or petechial rash.  BREASTS:  Not examined.  GU and RECTAL:  Deferred.  MUSCULOSKELETAL:  No spinal tenderness.  NEURO:  Nonfocal.   LABORATORY DATA:  Hemoglobin 8.3, hematocrit 27.7, white count 8.4,  platelets 271, MCV 65, ANC 57, monocytes 0.7, lymphocytes 1.1 retic  count 48, folic acid 8.3, B12 499, iron 10, TIBC 430, ferritin 3.  PTT  32, PT 15.5, INR 1.2.  Sodium 135, potassium 4.2, BUN 5, creatinine  0.74, glucose 86, calcium 8.7.  Sickle cell trait test negative.  Smear  on September 1 showed oval macrocytes and hypochromia.  Lead test,  Hemoccult, hepatic function panel, TSH, LDH and haptoglobin are pending.   ASSESSMENT/PLAN:  Dr. Arline Asp has seen and evaluated the patient.  This  is a 30 year old woman with iron deficiency anemia secondary to heavy  menses and 2 pregnancies.  There is no apparent gastrointestinal  bleeding by history.  Stools are pending.  No bowel movement while in  the hospital.  She admits pagophagia.  She was on iron during pregnancy  and tolerated it well.  Will check von Willebrand disease.  During this  hospitalization, she was given a prescription for iron sulfate, ferrous  sulfate, 2-3 times a day with meals.  The patient was strongly urged to  have follow-up with Dr. Arline Asp at the [facility].  If no  response to oral iron or poor tolerance, the patient may require IV iron  plus/minus GYN consult to decrease the menstrual losses.   Thank you very much for the consultation.      Marlowe Kays, P.A.      Samul Dada, M.D.  Electronically Signed    SW/MEDQ  D:  05/22/2009  T:  05/22/2009  Job:  [id number]

## 2011-06-07 LAB — CBC
HCT: 28.5 — ABNORMAL LOW
Hemoglobin: 9.4 — ABNORMAL LOW
MCHC: 33.1
MCV: 83.2
RDW: 15.6 — ABNORMAL HIGH

## 2011-06-07 LAB — COMPREHENSIVE METABOLIC PANEL
Alkaline Phosphatase: 83
BUN: 5 — ABNORMAL LOW
Creatinine, Ser: 0.52
Glucose, Bld: 101 — ABNORMAL HIGH
Potassium: 3.6
Total Protein: 6.6

## 2011-06-07 LAB — URIC ACID: Uric Acid, Serum: 2.6

## 2011-06-10 LAB — CBC
HCT: 29.7 — ABNORMAL LOW
Hemoglobin: 9.7 — ABNORMAL LOW
MCHC: 32.7
MCV: 80.7
Platelets: 212
RBC: 3.64 — ABNORMAL LOW
RDW: 17.4 — ABNORMAL HIGH
WBC: 10.8 — ABNORMAL HIGH
WBC: 12.4 — ABNORMAL HIGH

## 2011-06-10 LAB — CROSSMATCH: ABO/RH(D): AB POS

## 2011-06-10 LAB — DIFFERENTIAL
Eosinophils Absolute: 0
Monocytes Absolute: 0.9
Neutrophils Relative %: 77

## 2011-06-10 LAB — ABO/RH: ABO/RH(D): AB POS

## 2011-06-12 LAB — WET PREP, GENITAL
Clue Cells Wet Prep HPF POC: NONE SEEN
Trich, Wet Prep: NONE SEEN

## 2011-06-12 LAB — CBC
HCT: 34.2 — ABNORMAL LOW
Hemoglobin: 11.3 — ABNORMAL LOW
MCHC: 32.9
MCV: 82.1
RBC: 4.17
WBC: 7.7

## 2011-06-12 LAB — GC/CHLAMYDIA PROBE AMP, GENITAL: GC Probe Amp, Genital: NEGATIVE

## 2011-06-24 LAB — URINALYSIS, ROUTINE W REFLEX MICROSCOPIC
Hgb urine dipstick: NEGATIVE
Nitrite: NEGATIVE
Protein, ur: NEGATIVE
Specific Gravity, Urine: <1.005 — ABNORMAL LOW
Urobilinogen, UA: 0.2

## 2011-06-25 LAB — URINALYSIS, ROUTINE W REFLEX MICROSCOPIC
Glucose, UA: 100 — AB
Hgb urine dipstick: NEGATIVE
Ketones, ur: NEGATIVE
Protein, ur: NEGATIVE
Urobilinogen, UA: 0.2

## 2011-06-25 LAB — COMPREHENSIVE METABOLIC PANEL
Albumin: 2.7 — ABNORMAL LOW
BUN: 3 — ABNORMAL LOW
Calcium: 8.9
Creatinine, Ser: 0.49
GFR calc Af Amer: >60
Total Protein: 6.6

## 2011-06-25 LAB — CBC
HCT: 31 — ABNORMAL LOW
MCV: 85.8
Platelets: 227
RDW: 13.9

## 2011-06-27 LAB — URINALYSIS, ROUTINE W REFLEX MICROSCOPIC
Glucose, UA: NEGATIVE
Hgb urine dipstick: NEGATIVE
Specific Gravity, Urine: 1.013
Urobilinogen, UA: 0.2
pH: 7

## 2011-06-27 LAB — BASIC METABOLIC PANEL
CO2: 24
Calcium: 8.9
Chloride: 99
GFR calc Af Amer: >60
Potassium: 3.6
Sodium: 130 — ABNORMAL LOW

## 2011-06-27 LAB — URINE MICROSCOPIC-ADD ON

## 2012-04-21 ENCOUNTER — Encounter (HOSPITAL_COMMUNITY): Payer: Self-pay | Admitting: *Deleted

## 2012-04-21 DIAGNOSIS — A499 Bacterial infection, unspecified: Secondary | ICD-10-CM | POA: Insufficient documentation

## 2012-04-21 DIAGNOSIS — D649 Anemia, unspecified: Secondary | ICD-10-CM | POA: Insufficient documentation

## 2012-04-21 DIAGNOSIS — A599 Trichomoniasis, unspecified: Secondary | ICD-10-CM | POA: Insufficient documentation

## 2012-04-21 DIAGNOSIS — D259 Leiomyoma of uterus, unspecified: Secondary | ICD-10-CM | POA: Insufficient documentation

## 2012-04-21 DIAGNOSIS — N760 Acute vaginitis: Secondary | ICD-10-CM | POA: Insufficient documentation

## 2012-04-21 DIAGNOSIS — B9689 Other specified bacterial agents as the cause of diseases classified elsewhere: Secondary | ICD-10-CM | POA: Insufficient documentation

## 2012-04-21 LAB — CBC
HCT: 27.1 % — ABNORMAL LOW (ref 36.0–46.0)
MCHC: 26.2 g/dL — ABNORMAL LOW (ref 30.0–36.0)
MCV: 62.6 fL — ABNORMAL LOW (ref 78.0–100.0)
RDW: 19.4 % — ABNORMAL HIGH (ref 11.5–15.5)

## 2012-04-21 LAB — URINALYSIS, ROUTINE W REFLEX MICROSCOPIC
Glucose, UA: NEGATIVE mg/dL
Ketones, ur: 15 mg/dL — AB
Protein, ur: NEGATIVE mg/dL

## 2012-04-21 LAB — BASIC METABOLIC PANEL
BUN: 6 mg/dL (ref 6–23)
Creatinine, Ser: 0.75 mg/dL (ref 0.50–1.10)
GFR calc Af Amer: >90 mL/min (ref >90)
GFR calc non Af Amer: >90 mL/min (ref >90)
Potassium: 3.7 mEq/L (ref 3.5–5.1)

## 2012-04-21 NOTE — ED Notes (Signed)
Pt began having RLQ abd pain since yesterday, today pain spread to whole lower abdomen.  Nauseous, no vomiting with cold chills, denies urinary sx, denies diarrhea.

## 2012-04-22 ENCOUNTER — Emergency Department (HOSPITAL_COMMUNITY): Payer: Self-pay

## 2012-04-22 ENCOUNTER — Telehealth: Payer: Self-pay | Admitting: Obstetrics and Gynecology

## 2012-04-22 ENCOUNTER — Emergency Department (HOSPITAL_COMMUNITY)
Admission: EM | Admit: 2012-04-22 | Discharge: 2012-04-22 | Disposition: A | Discharge status: Home / Self Care | Payer: Self-pay | Attending: Emergency Medicine | Admitting: Emergency Medicine

## 2012-04-22 DIAGNOSIS — N760 Acute vaginitis: Secondary | ICD-10-CM

## 2012-04-22 DIAGNOSIS — D259 Leiomyoma of uterus, unspecified: Secondary | ICD-10-CM

## 2012-04-22 DIAGNOSIS — A599 Trichomoniasis, unspecified: Secondary | ICD-10-CM

## 2012-04-22 DIAGNOSIS — R102 Pelvic and perineal pain: Secondary | ICD-10-CM

## 2012-04-22 DIAGNOSIS — D649 Anemia, unspecified: Secondary | ICD-10-CM

## 2012-04-22 HISTORY — DX: Anemia, unspecified: D64.9

## 2012-04-22 LAB — WET PREP, GENITAL: Yeast Wet Prep HPF POC: NONE SEEN

## 2012-04-22 MED ORDER — OXYCODONE-ACETAMINOPHEN 5-325 MG PO TABS
2.0000 | ORAL_TABLET | Freq: Once | ORAL | Status: AC
  Administered 2012-04-22: 2 via ORAL
  Filled 2012-04-22: qty 2

## 2012-04-22 MED ORDER — OXYCODONE-ACETAMINOPHEN 5-325 MG PO TABS: 2.0000 | ORAL_TABLET | ORAL | Status: AC | PRN

## 2012-04-22 MED ORDER — METRONIDAZOLE 500 MG PO TABS
500.0000 mg | ORAL_TABLET | Freq: Once | ORAL | Status: AC
Start: 2012-04-22 — End: 2012-04-22
  Administered 2012-04-22: 500 mg via ORAL
  Filled 2012-04-22: qty 1

## 2012-04-22 MED ORDER — ONDANSETRON HCL 4 MG PO TABS: 4.0000 mg | ORAL_TABLET | Freq: Four times a day (QID) | ORAL | Status: AC

## 2012-04-22 MED ORDER — IBUPROFEN 800 MG PO TABS: 800.0000 mg | ORAL_TABLET | Freq: Three times a day (TID) | ORAL | Status: AC

## 2012-04-22 MED ORDER — METRONIDAZOLE 500 MG PO TABS: 500.0000 mg | ORAL_TABLET | Freq: Two times a day (BID) | ORAL | Status: AC

## 2012-04-22 NOTE — ED Provider Notes (Signed)
History     CSN: [id number]  Arrival date & time 04/21/12  2232   First MD Initiated Contact with Patient 04/22/12 0204      Chief Complaint  Patient presents with  . Abdominal Pain  . Back Pain    (Consider location/radiation/quality/duration/timing/severity/associated sxs/prior treatment) HPI 31 yo female presents to the ER c/o right sided and suprapubic abdominal pain.  No urinary symptoms, no n/v/d, last bm yesterday and normal, lmp 2 weeks ago, no fevers.  Pt reports similar pain in the past associated with her menstrual cycles.  Pt reports h/o heavy menses, anemia requiring transfusions in the past.  No weakness, sob, chest pain, dizziness.  No new sexual partnerss, no vaginal d/c  Past Medical History  Diagnosis Date  . Anemia     Past Surgical History  Procedure Date  . Tubal ligation   . Cesarean section     History reviewed. No pertinent family history.  History  Substance Use Topics  . Smoking status: Never Smoker   . Smokeless tobacco: Not on file  . Alcohol Use:     OB History    Grav Para Term Preterm Abortions TAB SAB Ect Mult Living                  Review of Systems  All other systems reviewed and are negative.    Allergies  Review of patient's allergies indicates no known allergies.  Home Medications   Current Outpatient Rx  Name Route Sig Dispense Refill  . IBUPROFEN 800 MG PO TABS Oral Take 1 tablet (800 mg total) by mouth 3 (three) times daily. 21 tablet 0  . METRONIDAZOLE 500 MG PO TABS Oral Take 1 tablet (500 mg total) by mouth 2 (two) times daily. 14 tablet 0  . ONDANSETRON HCL 4 MG PO TABS Oral Take 1 tablet (4 mg total) by mouth every 6 (six) hours. 12 tablet 0  . OXYCODONE-ACETAMINOPHEN 5-325 MG PO TABS Oral Take 2 tablets by mouth every 4 (four) hours as needed for pain. 20 tablet 0    BP 118/64  Pulse 68  Temp 98 F (36.7 C) (Oral)  Resp 18  SpO2 98%  LMP 03/30/2012  Physical Exam  Nursing note and vitals  reviewed. Constitutional: She is oriented to person, place, and time. She appears well-developed and well-nourished.  HENT:  Head: Normocephalic and atraumatic.  Nose: Nose normal.  Mouth/Throat: Oropharynx is clear and moist.  Eyes: Conjunctivae and EOM are normal. Pupils are equal, round, and reactive to light.  Neck: Normal range of motion. Neck supple. No JVD present. No tracheal deviation present. No thyromegaly present.  Cardiovascular: Normal rate, regular rhythm, normal heart sounds and intact distal pulses.  Exam reveals no gallop and no friction rub.   No murmur heard. Pulmonary/Chest: Effort normal and breath sounds normal. No stridor. No respiratory distress. She has no wheezes. She has no rales. She exhibits no tenderness.  Abdominal: Soft. Bowel sounds are normal. She exhibits no distension and no mass. There is no tenderness. There is no rebound and no guarding.  Genitourinary: Vaginal discharge (thick white d/c without odor) found.       Pt with right adnexa pain without mass.  Uterus tender nodular  Musculoskeletal: Normal range of motion. She exhibits no edema and no tenderness.  Lymphadenopathy:    She has no cervical adenopathy.  Neurological: She is alert and oriented to person, place, and time. She exhibits normal muscle tone. Coordination normal.  Skin:  Skin is dry. No rash noted. No erythema. No pallor.  Psychiatric: She has a normal mood and affect. Her behavior is normal. Judgment and thought content normal.    ED Course  Procedures (including critical care time)  Labs Reviewed  URINALYSIS, ROUTINE W REFLEX MICROSCOPIC - Abnormal; Notable for the following:    APPearance CLOUDY (*)     Ketones, ur 15 (*)     Leukocytes, UA TRACE (*)     All other components within normal limits  CBC - Abnormal; Notable for the following:    Hemoglobin 7.1 (*)     HCT 27.1 (*)     MCV 62.6 (*)     MCH 16.4 (*)     MCHC 26.2 (*)     RDW 19.4 (*)     All other components  within normal limits  BASIC METABOLIC PANEL - Abnormal; Notable for the following:    Glucose, Bld 101 (*)     All other components within normal limits  URINE MICROSCOPIC-ADD ON - Abnormal; Notable for the following:    Crystals CA OXALATE CRYSTALS (*)     All other components within normal limits  WET PREP, GENITAL - Abnormal; Notable for the following:    Trich, Wet Prep MANY (*)     Clue Cells Wet Prep HPF POC MODERATE (*)     WBC, Wet Prep HPF POC TOO NUMEROUS TO COUNT (*)     All other components within normal limits  POCT PREGNANCY, URINE  GC/CHLAMYDIA PROBE AMP, GENITAL   US Transvaginal Non-ob  04/22/2012  *RADIOLOGY REPORT*  Clinical Data: Right pelvic pain for 2 days.  TRANSABDOMINAL AND TRANSVAGINAL ULTRASOUND OF PELVIS Technique:  Both transabdominal and transvaginal ultrasound examinations of the pelvis were performed. Transabdominal technique was performed for global imaging of the pelvis including uterus, ovaries, adnexal regions, and pelvic cul-de-sac.  It was necessary to proceed with endovaginal exam following the transabdominal exam to visualize the endometrium and ovaries.  Comparison:  02/14/2008  Findings:  Uterus: The uterus appears retroverted.  The uterus measures 8.5 x 5.5 x 7.2 cm.  There is a posterior myometrial mass extending from the subserosal to the submucosal surface and measuring about 3.8 x 3.4 cm.  This is consistent with a fibroid.  Endometrium: Limited visualization of the endometrium due to a fibroid.  Endometrial stripe thickness is measured about 4 mm, normal.  Right ovary:  The right ovary measures 3.4 x 2.1 x 2.1 cm.  No abnormal adnexal masses.  Flow is demonstrated in the right ovary on color flow Doppler imaging.  Left ovary: The left ovary measures 4.7 x 2 x 2.7 cm.  No abnormal adnexal masses.  Flow is demonstrated in the left ovary on color Doppler imaging.  Other findings: Minimal free fluid in the cul-de-sac.  IMPRESSION: Uterine fibroid.   Examination is otherwise unremarkable.  Original Report Authenticated By: Marlon Pel, M.D.   US Pelvis Complete  04/22/2012  *RADIOLOGY REPORT*  Clinical Data: Right pelvic pain for 2 days.  TRANSABDOMINAL AND TRANSVAGINAL ULTRASOUND OF PELVIS Technique:  Both transabdominal and transvaginal ultrasound examinations of the pelvis were performed. Transabdominal technique was performed for global imaging of the pelvis including uterus, ovaries, adnexal regions, and pelvic cul-de-sac.  It was necessary to proceed with endovaginal exam following the transabdominal exam to visualize the endometrium and ovaries.  Comparison:  02/14/2008  Findings:  Uterus: The uterus appears retroverted.  The uterus measures 8.5 x 5.5 x 7.2 cm.  There is a posterior myometrial mass extending from the subserosal to the submucosal surface and measuring about 3.8 x 3.4 cm.  This is consistent with a fibroid.  Endometrium: Limited visualization of the endometrium due to a fibroid.  Endometrial stripe thickness is measured about 4 mm, normal.  Right ovary:  The right ovary measures 3.4 x 2.1 x 2.1 cm.  No abnormal adnexal masses.  Flow is demonstrated in the right ovary on color flow Doppler imaging.  Left ovary: The left ovary measures 4.7 x 2 x 2.7 cm.  No abnormal adnexal masses.  Flow is demonstrated in the left ovary on color Doppler imaging.  Other findings: Minimal free fluid in the cul-de-sac.  IMPRESSION: Uterine fibroid.  Examination is otherwise unremarkable.  Original Report Authenticated By: Marlon Pel, M.D.     1. Uterine fibroid   2. Anemia   3. Trichomonas   4. Bacterial vaginosis   5. Pelvic pain in female       MDM  31 yo female with lower abd pain.  Labs show significant anemia, but without symptoms.  U/s shows fibroid.  BV and trich noted.  WIll treat for same, have patient f/u with gyn for further workup of fibroid and heavy periods/anemia       [name] Mackie, MD 04/22/12 2112

## 2012-04-22 NOTE — ED Notes (Signed)
MD at bedside. 

## 2012-04-23 LAB — GC/CHLAMYDIA PROBE AMP, GENITAL
Chlamydia, DNA Probe: NEGATIVE
GC Probe Amp, Genital: NEGATIVE

## 2012-05-11 ENCOUNTER — Ambulatory Visit: Payer: Self-pay | Admitting: Obstetrics & Gynecology

## 2012-08-01 ENCOUNTER — Encounter (HOSPITAL_COMMUNITY): Payer: Self-pay | Admitting: *Deleted

## 2012-08-01 ENCOUNTER — Emergency Department (HOSPITAL_COMMUNITY)
Admission: EM | Admit: 2012-08-01 | Discharge: 2012-08-01 | Disposition: A | Discharge status: Home / Self Care | Payer: Self-pay | Attending: Emergency Medicine | Admitting: Emergency Medicine

## 2012-08-01 DIAGNOSIS — Z8742 Personal history of other diseases of the female genital tract: Secondary | ICD-10-CM | POA: Insufficient documentation

## 2012-08-01 DIAGNOSIS — Z86018 Personal history of other benign neoplasm: Secondary | ICD-10-CM

## 2012-08-01 DIAGNOSIS — Z3202 Encounter for pregnancy test, result negative: Secondary | ICD-10-CM | POA: Insufficient documentation

## 2012-08-01 DIAGNOSIS — D649 Anemia, unspecified: Secondary | ICD-10-CM | POA: Insufficient documentation

## 2012-08-01 DIAGNOSIS — A599 Trichomoniasis, unspecified: Secondary | ICD-10-CM | POA: Insufficient documentation

## 2012-08-01 DIAGNOSIS — R112 Nausea with vomiting, unspecified: Secondary | ICD-10-CM | POA: Insufficient documentation

## 2012-08-01 DIAGNOSIS — N949 Unspecified condition associated with female genital organs and menstrual cycle: Secondary | ICD-10-CM | POA: Insufficient documentation

## 2012-08-01 LAB — CBC WITH DIFFERENTIAL/PLATELET
Basophils Absolute: 0.1 10*3/uL (ref 0.0–0.1)
Eosinophils Absolute: 0.1 10*3/uL (ref 0.0–0.7)
Lymphs Abs: 1.5 10*3/uL (ref 0.7–4.0)
MCHC: 26.6 g/dL — ABNORMAL LOW (ref 30.0–36.0)
MCV: 62.7 fL — ABNORMAL LOW (ref 78.0–100.0)
Monocytes Relative: 7 % (ref 3–12)
Neutro Abs: 4.8 10*3/uL (ref 1.7–7.7)
Platelets: 253 10*3/uL (ref 150–400)
RDW: 18.2 % — ABNORMAL HIGH (ref 11.5–15.5)
WBC: 7 10*3/uL (ref 4.0–10.5)

## 2012-08-01 LAB — WET PREP, GENITAL

## 2012-08-01 LAB — COMPREHENSIVE METABOLIC PANEL
Albumin: 3.4 g/dL — ABNORMAL LOW (ref 3.5–5.2)
Alkaline Phosphatase: 63 U/L (ref 39–117)
BUN: 7 mg/dL (ref 6–23)
Calcium: 9.1 mg/dL (ref 8.4–10.5)
Creatinine, Ser: 0.73 mg/dL (ref 0.50–1.10)
GFR calc Af Amer: >90 mL/min (ref >90)
Glucose, Bld: 99 mg/dL (ref 70–99)
Potassium: 3.6 mEq/L (ref 3.5–5.1)
Total Protein: 7.6 g/dL (ref 6.0–8.3)

## 2012-08-01 LAB — URINALYSIS, ROUTINE W REFLEX MICROSCOPIC
Glucose, UA: NEGATIVE mg/dL
Hgb urine dipstick: NEGATIVE
Ketones, ur: NEGATIVE mg/dL
Protein, ur: NEGATIVE mg/dL
pH: 7.5 (ref 5.0–8.0)

## 2012-08-01 LAB — PREGNANCY, URINE: Preg Test, Ur: NEGATIVE

## 2012-08-01 MED ORDER — OXYCODONE-ACETAMINOPHEN 5-325 MG PO TABS
2.0000 | ORAL_TABLET | Freq: Once | ORAL | Status: DC
  Filled 2012-08-01: qty 2

## 2012-08-01 MED ORDER — METRONIDAZOLE 500 MG PO TABS: 500.0000 mg | ORAL_TABLET | Freq: Two times a day (BID) | ORAL | Status: DC

## 2012-08-01 NOTE — ED Notes (Signed)
The pt is c/o abd pain since 2200 last pm.   With n.  She is constipated and she has small hard balls of stool.  lmp oct

## 2012-08-01 NOTE — ED Notes (Signed)
Pt called and sts she left her d/c papers at hospital along with Rx.  Rx for flagyl called into [facility] on [address] at [phone].

## 2012-08-01 NOTE — ED Notes (Signed)
The pt is unable to void at present 

## 2012-08-01 NOTE — ED Notes (Signed)
Discharge instructions reviewed. Pt verbalized understanding.  

## 2012-08-01 NOTE — ED Provider Notes (Signed)
History     CSN: [id number]  Arrival date & time 08/01/12  4098   First MD Initiated Contact with Patient 08/01/12 [phone]      Chief Complaint  Patient presents with  . Abdominal Cramping    (Consider location/radiation/quality/duration/timing/severity/associated sxs/prior treatment) HPI Comments: Patient presents with a chief complaint of abdominal pain.  She reports that the pain is located primarily in the suprapubic area and the LLQ.  Pain has been present since last evening.  She reports that she did have one episode of vomiting earlier today.  She denies diarrhea.  She states that she has not been constipated.  She had a BM two days ago, which was soft.  She denies dysuria, increased urinary urgency, or increased urinary frequency.  She denies fever or chills.  She does have a history of Uterine Fibroids.  She reports that she has very heavy periods due to that and that she has a history of Anemia secondary to that.  LMP was one or two weeks ago.  She denies vaginal bleeding at this time.  She denies SOB, weakness, lightheadedness, or syncope.    The history is provided by the patient.    Past Medical History  Diagnosis Date  . Anemia     Past Surgical History  Procedure Date  . Tubal ligation   . Cesarean section     No family history on file.  History  Substance Use Topics  . Smoking status: Never Smoker   . Smokeless tobacco: Not on file  . Alcohol Use: No    OB History    Grav Para Term Preterm Abortions TAB SAB Ect Mult Living                  Review of Systems  Constitutional: Negative for fever and chills.  Respiratory: Negative for shortness of breath.   Gastrointestinal: Positive for nausea, vomiting and abdominal pain. Negative for diarrhea, constipation and blood in stool.  Genitourinary: Positive for pelvic pain. Negative for dysuria, urgency, frequency and vaginal discharge.  Neurological: Negative for dizziness, syncope and light-headedness.     Allergies  Review of patient's allergies indicates no known allergies.  Home Medications   Current Outpatient Rx  Name  Route  Sig  Dispense  Refill  . OXYCODONE-ACETAMINOPHEN 5-325 MG PO TABS   Oral   Take 1 tablet by mouth every 4 (four) hours as needed. pain           BP 132/69  Pulse 79  Temp 98.4 F (36.9 C) (Oral)  Resp 18  SpO2 100%  LMP 07/07/2012  Physical Exam  Nursing note and vitals reviewed. Constitutional: She appears well-developed and well-nourished.  HENT:  Head: Normocephalic and atraumatic.  Mouth/Throat: Oropharynx is clear and moist.  Eyes: EOM are normal. Pupils are equal, round, and reactive to light.  Cardiovascular: Normal rate, regular rhythm and normal heart sounds.   Pulmonary/Chest: Effort normal and breath sounds normal.  Abdominal: Soft. Bowel sounds are normal. She exhibits no distension and no mass. There is tenderness in the suprapubic area and left lower quadrant. There is no rigidity, no rebound and no guarding.  Genitourinary: Cervix exhibits no motion tenderness. Right adnexum displays no mass, no tenderness and no fullness. Left adnexum displays no mass, no tenderness and no fullness. No bleeding around the vagina.       No vaginal bleeding at this time  Neurological: She is alert.  Skin: Skin is warm and dry.  Psychiatric: She  has a normal mood and affect.    ED Course  Procedures (including critical care time)   Labs Reviewed  URINALYSIS, ROUTINE W REFLEX MICROSCOPIC  PREGNANCY, URINE  WET PREP, GENITAL  GC/CHLAMYDIA PROBE AMP  CBC WITH DIFFERENTIAL  BASIC METABOLIC PANEL   No results found.   No diagnosis found.    MDM  Patient very anemic with Hgb of 6.7.  However, patient has a long standing history of anemia.  Hgb three months ago was 7.1.  She is currently asymptomatic from this hemoglobin.  VSS.  Therefore, do not think that blood transfusion is indicated at this time.  Patient with pelvic pain that she  reports is similar to pain that she has had in the past.  Patient with known Uterine Fibroids.  Patient afebrile.  Labs unremarkable.  No rebound or guarding on exam.  Therefore, doubt surgical abdomen.  Feel that pain is most likely secondary to Uterine Fibroids.  UA and Urine Pregnancy negative.  Wet prep did show [employer].  Patient treated with Flagyl.  Patient instructed to follow up with OB/GYN regarding her Uterine Fibroids.        Pascal Lux Idamay, PA-C 08/02/12 1827

## 2012-08-02 LAB — GC/CHLAMYDIA PROBE AMP: GC Probe RNA: NEGATIVE

## 2012-08-06 NOTE — ED Provider Notes (Signed)
Medical screening examination/treatment/procedure(s) were performed by non-physician practitioner and as supervising physician I was immediately available for consultation/collaboration.  Hurman Horn, MD 08/06/12 1630

## 2012-11-16 IMAGING — US US TRANSVAGINAL NON-OB
1 series · 13 of 25 positions shown · non-contrast
Comparison: 02/14/2008

CLINICAL DATA: Right pelvic pain for 2 days.



[Series 1: us transvaginal non-ob · 0.20mm/px · 13 of 40 slices shown]
[im 1/40]
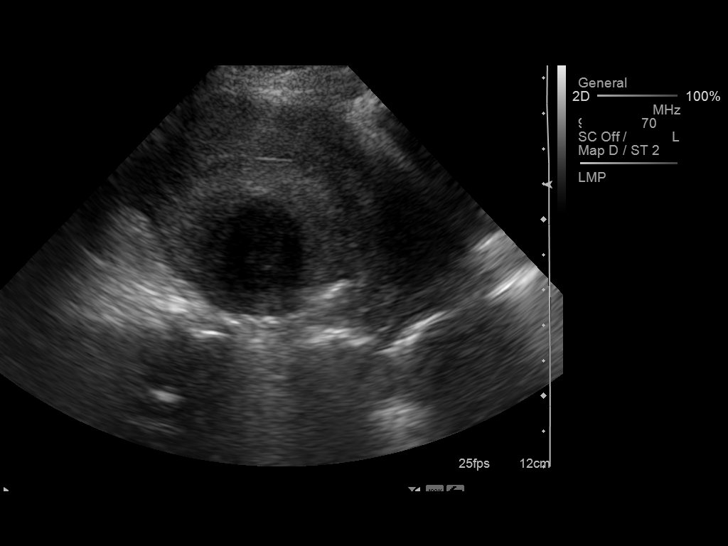
[im 4/40]
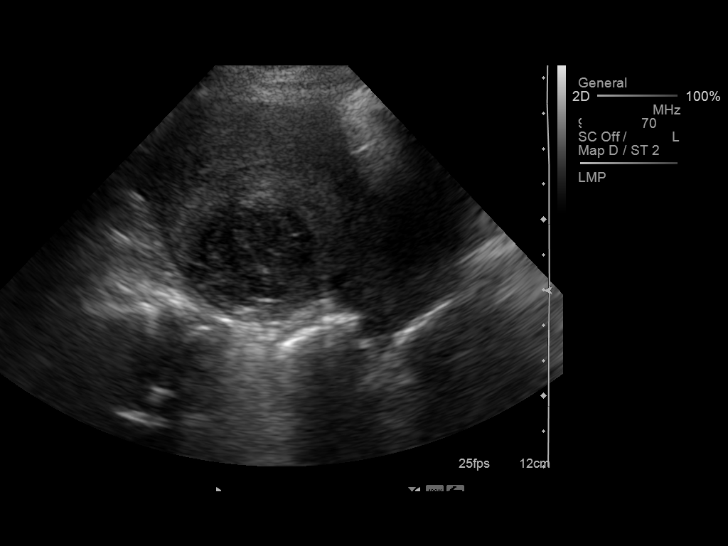
[im 7/40]
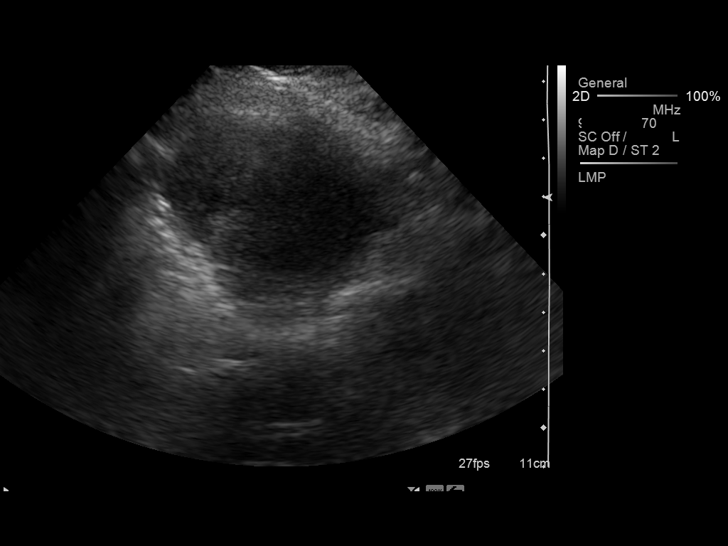
[im 10/40]
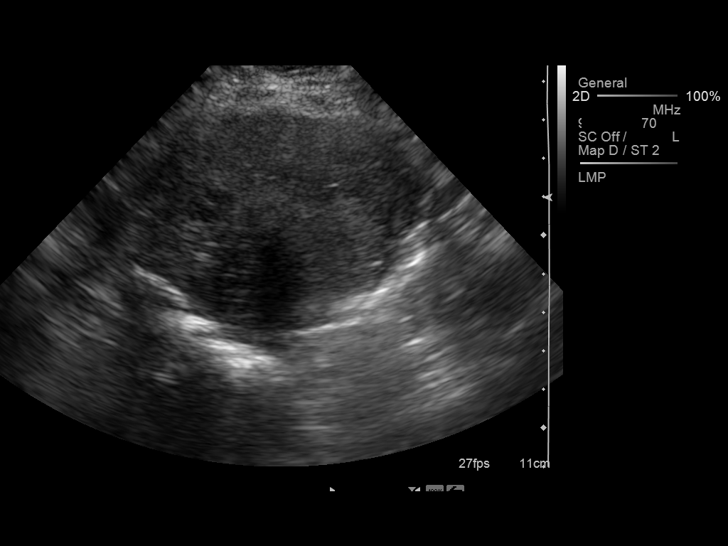
[im 14/40]
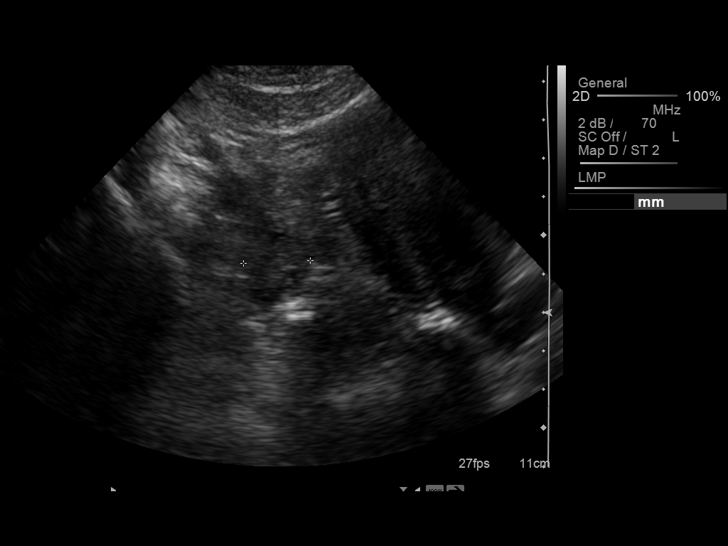
[im 17/40]
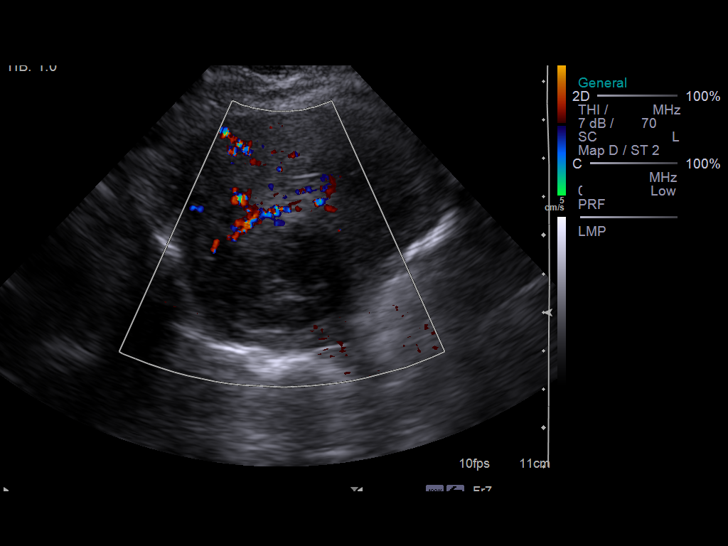
[im 20/40]
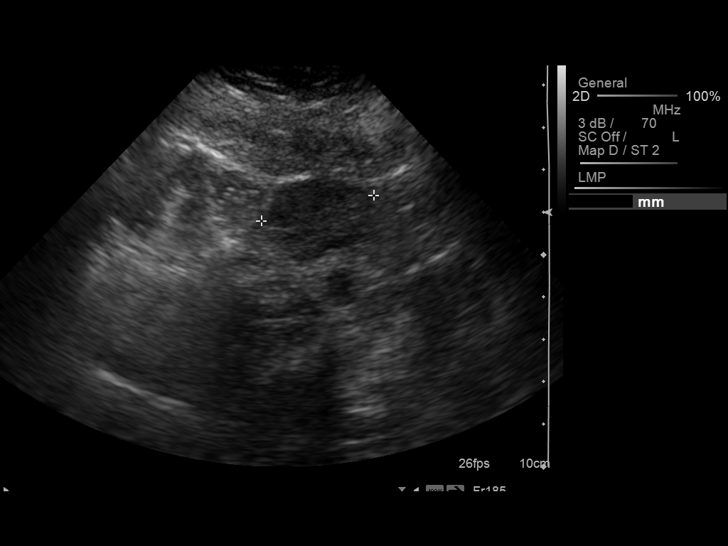
[im 23/40]
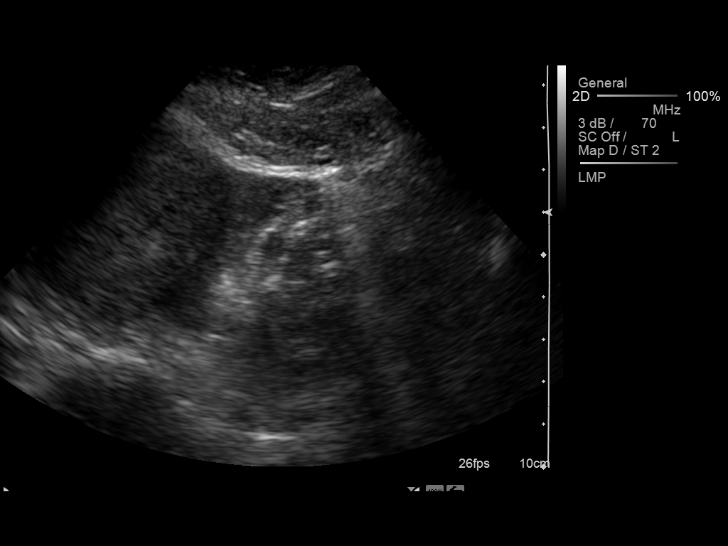
[im 27/40]
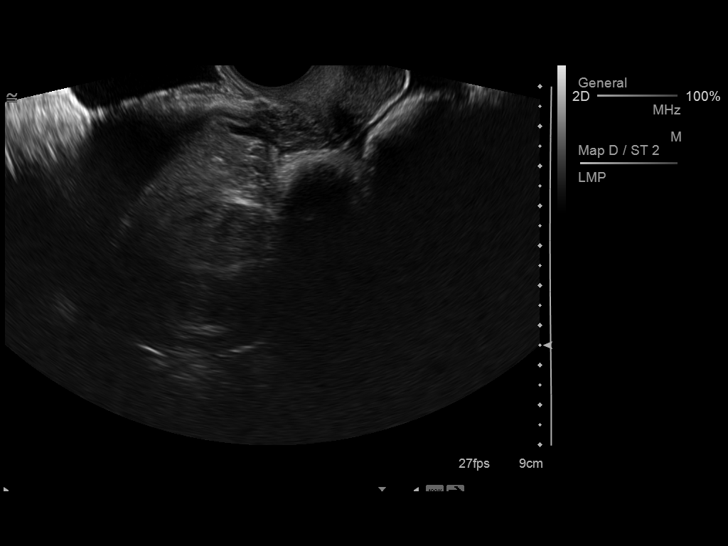
[im 30/40]
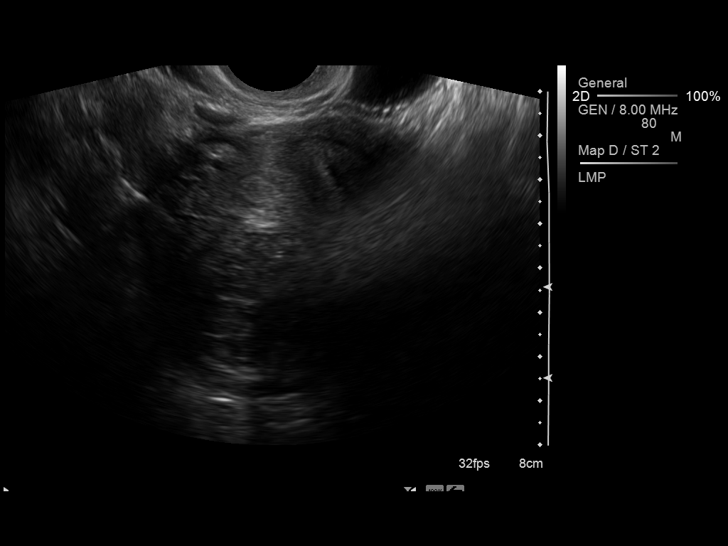
[im 33/40]
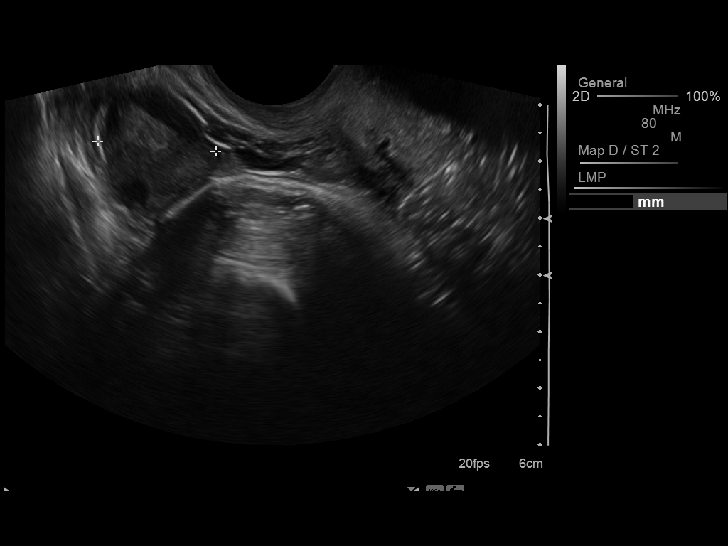
[im 36/40]
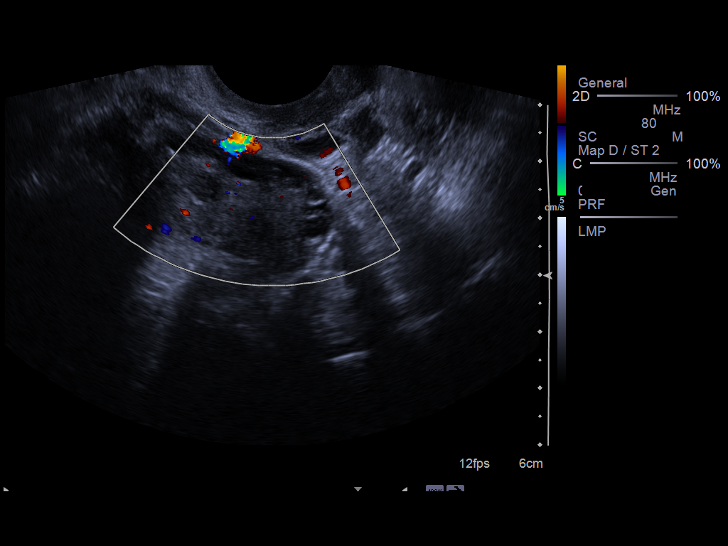
[im 40/40]
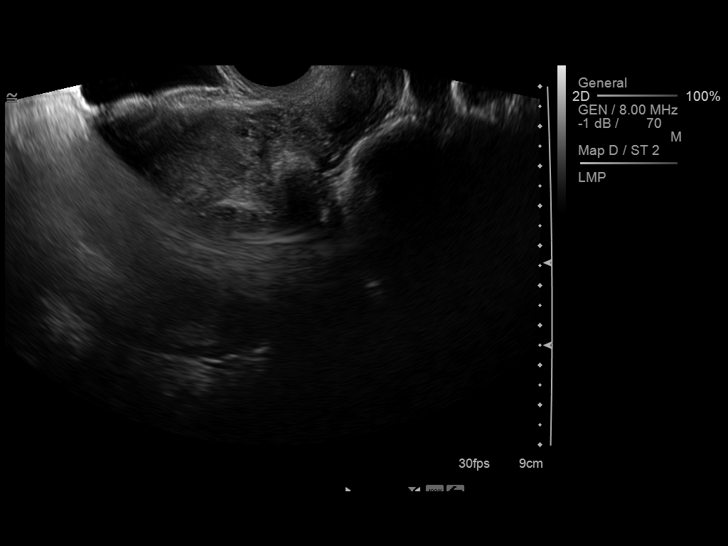

[13 of 25 positions shown; findings below may reference images not displayed]

FINDINGS: Uterus: The uterus appears retroverted.  The uterus measures 8.5 x
5.5 x 7.2 cm.  There is a posterior myometrial mass extending from
the subserosal to the submucosal surface and measuring about 3.8 x
3.4 cm.  This is consistent with a fibroid.

Endometrium: Limited visualization of the endometrium due to a
fibroid.  Endometrial stripe thickness is measured about 4 mm,
normal.

Right ovary:  The right ovary measures 3.4 x 2.1 x 2.1 cm.  No
abnormal adnexal masses.  Flow is demonstrated in the right ovary
on color flow Doppler imaging.

Left ovary: The left ovary measures 4.7 x 2 x 2.7 cm.  No abnormal
adnexal masses.  Flow is demonstrated in the left ovary on color
Doppler imaging.

Other findings: Minimal free fluid in the cul-de-sac.
IMPRESSION: Uterine fibroid.  Examination is otherwise unremarkable..

## 2013-11-08 ENCOUNTER — Emergency Department (HOSPITAL_BASED_OUTPATIENT_CLINIC_OR_DEPARTMENT_OTHER)
Admission: EM | Admit: 2013-11-08 | Discharge: 2013-11-08 | Discharge status: Against Medical Advice | Payer: BC Managed Care – PPO | Attending: Emergency Medicine | Admitting: Emergency Medicine

## 2013-11-08 ENCOUNTER — Encounter (HOSPITAL_BASED_OUTPATIENT_CLINIC_OR_DEPARTMENT_OTHER): Payer: Self-pay | Admitting: Emergency Medicine

## 2013-11-08 DIAGNOSIS — R5383 Other fatigue: Principal | ICD-10-CM

## 2013-11-08 DIAGNOSIS — R5381 Other malaise: Secondary | ICD-10-CM | POA: Insufficient documentation

## 2013-11-08 NOTE — ED Notes (Signed)
Pt c/o generalized weakness, chest pain and headache since waking up this morning.

## 2013-11-10 NOTE — ED Provider Notes (Signed)
PT left without being seen after triage.  I was not involved in her care.  [place], DO 11/10/13 1615

## 2013-11-26 ENCOUNTER — Inpatient Hospital Stay (HOSPITAL_COMMUNITY)
Admission: AD | Admit: 2013-11-26 | Discharge: 2013-11-26 | Disposition: A | Discharge status: Home / Self Care | Payer: BC Managed Care – PPO | Source: Ambulatory Visit | Attending: Family Medicine | Admitting: Family Medicine

## 2013-11-26 ENCOUNTER — Encounter (HOSPITAL_COMMUNITY): Payer: Self-pay

## 2013-11-26 DIAGNOSIS — N925 Other specified irregular menstruation: Secondary | ICD-10-CM | POA: Insufficient documentation

## 2013-11-26 DIAGNOSIS — N949 Unspecified condition associated with female genital organs and menstrual cycle: Secondary | ICD-10-CM | POA: Insufficient documentation

## 2013-11-26 DIAGNOSIS — N898 Other specified noninflammatory disorders of vagina: Secondary | ICD-10-CM

## 2013-11-26 DIAGNOSIS — A5901 Trichomonal vulvovaginitis: Secondary | ICD-10-CM | POA: Insufficient documentation

## 2013-11-26 DIAGNOSIS — R109 Unspecified abdominal pain: Secondary | ICD-10-CM | POA: Insufficient documentation

## 2013-11-26 DIAGNOSIS — D649 Anemia, unspecified: Secondary | ICD-10-CM | POA: Insufficient documentation

## 2013-11-26 DIAGNOSIS — N939 Abnormal uterine and vaginal bleeding, unspecified: Secondary | ICD-10-CM

## 2013-11-26 DIAGNOSIS — N938 Other specified abnormal uterine and vaginal bleeding: Secondary | ICD-10-CM | POA: Insufficient documentation

## 2013-11-26 LAB — CBC
HEMATOCRIT: 26.4 % — AB (ref 36.0–46.0)
Hemoglobin: 7.2 g/dL — ABNORMAL LOW (ref 12.0–15.0)
MCH: 18 pg — ABNORMAL LOW (ref 26.0–34.0)
MCHC: 27.3 g/dL — AB (ref 30.0–36.0)
MCV: 66.2 fL — AB (ref 78.0–100.0)
Platelets: 282 10*3/uL (ref 150–400)
RBC: 3.99 MIL/uL (ref 3.87–5.11)
RDW: 21.2 % — AB (ref 11.5–15.5)
WBC: 5.7 10*3/uL (ref 4.0–10.5)

## 2013-11-26 LAB — URINE MICROSCOPIC-ADD ON

## 2013-11-26 LAB — WET PREP, GENITAL: Yeast Wet Prep HPF POC: NONE SEEN

## 2013-11-26 LAB — URINALYSIS, ROUTINE W REFLEX MICROSCOPIC
BILIRUBIN URINE: NEGATIVE
Glucose, UA: NEGATIVE mg/dL
Ketones, ur: NEGATIVE mg/dL
Leukocytes, UA: NEGATIVE
Nitrite: NEGATIVE
PROTEIN: NEGATIVE mg/dL
Specific Gravity, Urine: >1.03 — ABNORMAL HIGH (ref 1.005–1.030)
UROBILINOGEN UA: 1 mg/dL (ref 0.0–1.0)
pH: 6 (ref 5.0–8.0)

## 2013-11-26 LAB — POCT PREGNANCY, URINE: Preg Test, Ur: NEGATIVE

## 2013-11-26 MED ORDER — MEDROXYPROGESTERONE ACETATE 10 MG PO TABS: 10.0000 mg | ORAL_TABLET | Freq: Every day | ORAL | Status: DC

## 2013-11-26 MED ORDER — METRONIDAZOLE 500 MG PO TABS
2000.0000 mg | ORAL_TABLET | Freq: Once | ORAL | Status: AC
  Administered 2013-11-26: 2000 mg via ORAL
  Filled 2013-11-26: qty 4

## 2013-11-26 NOTE — Discharge Instructions (Signed)
Abnormal Uterine Bleeding Abnormal uterine bleeding means bleeding from the vagina that is not your normal menstrual period. This can be:  Bleeding or spotting between periods.  Bleeding after sex (sexual intercourse).  Bleeding that is heavier or more than normal.  Periods that last longer than usual.  Bleeding after menopause. There are many problems that may cause this. Treatment will depend on the cause of the bleeding. Any kind of bleeding that is not normal should be reviewed by your doctor.  HOME CARE Watch your condition for any changes. These actions may lessen any discomfort you are having:  Do not use tampons or douches as told by your doctor.  Change your pads often. You should get regular pelvic exams and Pap tests. Keep all appointments for tests as told by your doctor. GET HELP IF:  You are bleeding for more than 1 week.  You feel dizzy at times. GET HELP RIGHT AWAY IF:   You pass out.  You have to change pads every 15 to 30 minutes.  You have belly pain.  You have a fever.  You become sweaty or weak.  You are passing large blood clots from the vagina.  You feel sick to your stomach (nauseous) and throw up (vomit). MAKE SURE YOU:  Understand these instructions.  Will watch your condition.  Will get help right away if you are not doing well or get worse. Document Released: 06/30/2009 Document Revised: 06/23/2013 Document Reviewed: 04/01/2013 [employer] Patient Information 2014 [employer].  

## 2013-11-26 NOTE — MAU Note (Signed)
Bled 02/17- started when expected cycle, only bled one day, then stopped.  Started again on 02/26- bleeding every day since.

## 2013-11-26 NOTE — MAU Provider Note (Signed)
Attestation of Attending Supervision of Advanced Practitioner (PA/CNM/NP): Evaluation and management procedures were performed by the Advanced Practitioner under my supervision and collaboration.  I have reviewed the Advanced Practitioner's note and chart, and I agree with the management and plan.  Donnamae Jude, MD [facility] Attending 11/26/2013 2:28 PM

## 2013-11-26 NOTE — MAU Provider Note (Signed)
History     CSN: [id number]  Arrival date and time: 11/26/13 6415   First Provider Initiated Contact with Patient 11/26/13 [phone]      Chief Complaint  Patient presents with  . Vaginal Bleeding   HPI  Ms. [name] [name] is a 33 y.o. female [phone] who presents to MAU with complaints of vaginal bleeding. Pt has a long standing history of abnormal cycles ever since her menstrual cycles started at age 80. Pt has never tried birth control or hormonal options to help regulate her cycles, however did not like the way it made her feel.  On February 17 she started her menstrual cycle; bled for one day and stopped.  On February 26 she started bleeding again; and has not stopped since then. Pt is going through 4-5 pads in a 12 hour period. Pt complains of cramping and abdominal pain.  Currently her bleeding is light. She has not taken anything for the cramping and declines the need for medication.    OB History   Grav Para Term Preterm Abortions TAB SAB Ect Mult Living   3 2 2  1 1    2       Past Medical History  Diagnosis Date  . Anemia     Past Surgical History  Procedure Laterality Date  . Tubal ligation    . Cesarean section      History reviewed. No pertinent family history.  History  Substance Use Topics  . Smoking status: Never Smoker   . Smokeless tobacco: Not on file  . Alcohol Use: No    Allergies: No Known Allergies  Prescriptions prior to admission  Medication Sig Dispense Refill  . ferrous sulfate 325 (65 FE) MG tablet Take 325 mg by mouth daily with breakfast.      . metroNIDAZOLE (FLAGYL) 500 MG tablet Take 1 tablet (500 mg total) by mouth 2 (two) times daily.  14 tablet  0  . oxyCODONE-acetaminophen (PERCOCET/ROXICET) 5-325 MG per tablet Take 1 tablet by mouth every 4 (four) hours as needed. pain       Results for orders placed during the hospital encounter of 11/26/13 (from the past 48 hour(s))  URINALYSIS, ROUTINE W REFLEX MICROSCOPIC     Status: Abnormal    Collection Time    11/26/13  9:18 AM      Result Value Ref Range   Color, Urine YELLOW  YELLOW   APPearance CLEAR  CLEAR   Specific Gravity, Urine >1.030 (*) 1.005 - 1.030   pH 6.0  5.0 - 8.0   Glucose, UA NEGATIVE  NEGATIVE mg/dL   Hgb urine dipstick LARGE (*) NEGATIVE   Bilirubin Urine NEGATIVE  NEGATIVE   Ketones, ur NEGATIVE  NEGATIVE mg/dL   Protein, ur NEGATIVE  NEGATIVE mg/dL   Urobilinogen, UA 1.0  0.0 - 1.0 mg/dL   Nitrite NEGATIVE  NEGATIVE   Leukocytes, UA NEGATIVE  NEGATIVE  URINE MICROSCOPIC-ADD ON     Status: None   Collection Time    11/26/13  9:18 AM      Result Value Ref Range   Squamous Epithelial / LPF RARE  RARE   WBC, UA 3-6  <3 WBC/hpf   Urine-Other TRICHOMONAS PRESENT    POCT PREGNANCY, URINE     Status: None   Collection Time    11/26/13  9:20 AM      Result Value Ref Range   Preg Test, Ur NEGATIVE  NEGATIVE   Comment:  THE SENSITIVITY OF THIS     METHODOLOGY IS >24 mIU/mL  CBC     Status: Abnormal   Collection Time    11/26/13  9:46 AM      Result Value Ref Range   WBC 5.7  4.0 - 10.5 K/uL   RBC 3.99  3.87 - 5.11 MIL/uL   Hemoglobin 7.2 (*) 12.0 - 15.0 g/dL   HCT 26.4 (*) 36.0 - 46.0 %   MCV 66.2 (*) 78.0 - 100.0 fL   MCH 18.0 (*) 26.0 - 34.0 pg   MCHC 27.3 (*) 30.0 - 36.0 g/dL   RDW 21.2 (*) 11.5 - 15.5 %   Platelets 282  150 - 400 K/uL   Review of Systems  Constitutional: Negative for fever and chills.  Eyes: Negative for blurred vision.  Gastrointestinal: Positive for abdominal pain (Lower abdominal cramping ). Negative for nausea and vomiting.  Neurological: Negative for dizziness and weakness (Had weakness yesterday, none today).   Physical Exam   Blood pressure 112/84, pulse 78, temperature 98.2 F (36.8 C), temperature source Oral, resp. rate 18, height 5\' 3"  (1.6 m), weight 105.235 kg (232 lb), last menstrual period 11/11/2013.  Physical Exam  Constitutional: She is oriented to person, place, and time. She appears  well-developed and well-nourished. No distress.  Eyes: Pupils are equal, round, and reactive to light.  Neck: Normal range of motion.  GI: Soft. She exhibits no distension and no mass. There is no tenderness.  Genitourinary:  Speculum exam: Vagina - Small amount of dark red, bubbly blood in vaginal canal, no odor  Cervix - No contact bleeding, no active bleeding  Bimanual exam: Cervix closed, No CMT  Uterus non tender, normal size Adnexa non tender, no masses bilaterally GC/Chlam, wet prep done Chaperone present for exam.   Musculoskeletal: Normal range of motion.  Neurological: She is alert and oriented to person, place, and time.  Skin: Skin is warm. She is not diaphoretic.  Psychiatric: Her behavior is normal.    MAU Course  Procedures None  MDM CBC  Last pelvic US done in 2013 showed Uterine Fibroids.  Flagyl 2 grams given PO in MAU  Following discharge; discussed plan of care with Dr. Kennon Rounds.   Assessment and Plan   Assessment:  Abnormal vaginal bleeding Anemia; chronic  Trichomonas; recurrent   Plan:  Discharge home in stable condition RX: Provera X 10 days  Out patient pelvic US scheduled for March 20 at 4:00  Referral sent to Clinic for follow up Bleeding precautions discussed  Encouraged and discussed the importance of taking over the counter iron daily.  Discussed treating the patients partner today for Trichomonas; patient did not want to call at this time to confirm allergies. Partner will need treatment at the health department   [place], NP  11/26/2013, 12:10 PM

## 2013-11-27 LAB — GC/CHLAMYDIA PROBE AMP
CT Probe RNA: NEGATIVE
GC Probe RNA: NEGATIVE

## 2013-12-01 ENCOUNTER — Other Ambulatory Visit (HOSPITAL_COMMUNITY): Payer: Self-pay | Admitting: Obstetrics and Gynecology

## 2013-12-01 DIAGNOSIS — N939 Abnormal uterine and vaginal bleeding, unspecified: Secondary | ICD-10-CM

## 2013-12-02 ENCOUNTER — Telehealth: Payer: Self-pay | Admitting: Obstetrics & Gynecology

## 2013-12-02 NOTE — Telephone Encounter (Signed)
Called patient to inform of follow up appointment but no answer. Left message that I was calling with appointment information and if she could call back for appointment details.

## 2013-12-03 ENCOUNTER — Ambulatory Visit (HOSPITAL_COMMUNITY): Payer: BC Managed Care – PPO

## 2013-12-07 ENCOUNTER — Ambulatory Visit (HOSPITAL_COMMUNITY)
Admission: RE | Admit: 2013-12-07 | Discharge: 2013-12-07 | Disposition: A | Discharge status: Home / Self Care | Payer: BC Managed Care – PPO | Source: Ambulatory Visit | Attending: Obstetrics and Gynecology | Admitting: Obstetrics and Gynecology

## 2013-12-07 DIAGNOSIS — N925 Other specified irregular menstruation: Secondary | ICD-10-CM | POA: Insufficient documentation

## 2013-12-07 DIAGNOSIS — D252 Subserosal leiomyoma of uterus: Secondary | ICD-10-CM | POA: Insufficient documentation

## 2013-12-07 DIAGNOSIS — N949 Unspecified condition associated with female genital organs and menstrual cycle: Secondary | ICD-10-CM | POA: Insufficient documentation

## 2013-12-07 DIAGNOSIS — N938 Other specified abnormal uterine and vaginal bleeding: Secondary | ICD-10-CM | POA: Insufficient documentation

## 2013-12-07 DIAGNOSIS — N939 Abnormal uterine and vaginal bleeding, unspecified: Secondary | ICD-10-CM

## 2013-12-07 DIAGNOSIS — D251 Intramural leiomyoma of uterus: Secondary | ICD-10-CM | POA: Insufficient documentation

## 2013-12-07 DIAGNOSIS — R9389 Abnormal findings on diagnostic imaging of other specified body structures: Secondary | ICD-10-CM | POA: Insufficient documentation

## 2013-12-10 ENCOUNTER — Ambulatory Visit (INDEPENDENT_AMBULATORY_CARE_PROVIDER_SITE_OTHER): Payer: BC Managed Care – PPO | Admitting: Obstetrics & Gynecology

## 2013-12-10 VITALS — BP 129/80 | HR 76 | Temp 97.9°F | Ht 65.0 in | Wt 228.6 lb

## 2013-12-10 DIAGNOSIS — N920 Excessive and frequent menstruation with regular cycle: Secondary | ICD-10-CM | POA: Insufficient documentation

## 2013-12-10 DIAGNOSIS — D251 Intramural leiomyoma of uterus: Secondary | ICD-10-CM | POA: Insufficient documentation

## 2013-12-10 MED ORDER — MEDROXYPROGESTERONE ACETATE 10 MG PO TABS: 10.0000 mg | ORAL_TABLET | Freq: Every day | ORAL | Status: DC

## 2013-12-10 NOTE — Patient Instructions (Signed)

## 2013-12-10 NOTE — Progress Notes (Signed)
Subjective:     Patient ID: [name] [name], female   DOB: February 17, 1981, 33 y.o.   MRN: [id number]  [id number] Patient's last menstrual period was 11/11/2013. H/o heavy menses for 4 days, regular, with period last month for more than 2 weeks, stopped with provera rx in [facility]. US showed intramural fibroids. Past Surgical History  Procedure Laterality Date  . Tubal ligation    . Cesarean section     Past Medical History  Diagnosis Date  . Anemia   transfused 2010   Review of Systems  Constitutional: Negative.   Genitourinary: Positive for vaginal bleeding and menstrual problem. Negative for vaginal discharge and pelvic pain.       Objective:   Physical Exam  Vitals reviewed. Constitutional: She appears well-developed. No distress.  Pulmonary/Chest: Effort normal. No respiratory distress.  Skin: Skin is warm and dry.  Psychiatric: She has a normal mood and affect. Her behavior is normal.   CLINICAL DATA: Abnormal bleeding. History of fibroids  EXAM:  TRANSABDOMINAL AND TRANSVAGINAL ULTRASOUND OF PELVIS  TECHNIQUE:  Both transabdominal and transvaginal ultrasound examinations of the  pelvis were performed. Transabdominal technique was performed for  global imaging of the pelvis including uterus, ovaries, adnexal  regions, and pelvic cul-de-sac. It was necessary to proceed with  endovaginal exam following the transabdominal exam to visualize the  endometrium.  COMPARISON: US PELVIS COMPLETE dated 04/22/2012  FINDINGS:  Uterus  Measurements: 11.3 x 6.5 x 6.6 cm. Several small fibroids. The  largest is a left fundal subserosal fibroid measuring 2.6 x 2.5 x  2.4 cm. Adjacent 1.8 cm and 2.3 cm intramural fibroids. Anterior  subserosal 2.3 cm fibroid.  Endometrium  Thickness: Thickened, measuring up to 21 mm.  Right ovary  Measurements: 3.8 x 2.3 x 1.9 cm. Probable small corpus luteum  cyst. Normal appearance/no adnexal mass.  Left ovary  Measurements: 4.3 x 2.0 x 2.4 cm. Normal  appearance/no adnexal mass.  Other findings  No free fluid.  IMPRESSION:  Multiple uterine fibroids as described above.  Thickened endometrium. If bleeding remains unresponsive to hormonal  or medical therapy, focal lesion work-up with sonohysterogram should  be considered. Endometrial biopsy should also be considered in  pre-menopausal patients at high risk for endometrial carcinoma.  (Ref: Radiological Reasoning: Algorithmic Workup of Abnormal Vaginal  Bleeding with Endovaginal Sonography and Sonohysterography. AJR  2008; [id number])  Electronically Signed  By: Rolm Baptise M.D.  On: 12/07/2013 17:21      Assessment:     Menorrhagia, fiboids, s/p trichomonas, s/p BTL, candidate for endometrial ablation     Plan:     Provera daily, continue OTC Fe, RTC for pap and Bx endometrium Plan to schedule endometrial ablation  12/10/2013 Woodroe Mode, MD

## 2013-12-12 ENCOUNTER — Encounter: Payer: Self-pay | Admitting: Obstetrics & Gynecology

## 2014-01-12 ENCOUNTER — Ambulatory Visit (INDEPENDENT_AMBULATORY_CARE_PROVIDER_SITE_OTHER): Payer: BC Managed Care – PPO | Admitting: Obstetrics & Gynecology

## 2014-01-12 ENCOUNTER — Other Ambulatory Visit (HOSPITAL_COMMUNITY)
Admission: RE | Admit: 2014-01-12 | Discharge: 2014-01-12 | Disposition: A | Discharge status: Home / Self Care | Payer: BC Managed Care – PPO | Source: Ambulatory Visit | Attending: Obstetrics & Gynecology | Admitting: Obstetrics & Gynecology

## 2014-01-12 ENCOUNTER — Encounter: Payer: Self-pay | Admitting: Obstetrics & Gynecology

## 2014-01-12 VITALS — BP 134/63 | HR 74 | Temp 97.5°F | Resp 20 | Ht 62.0 in | Wt 224.0 lb

## 2014-01-12 DIAGNOSIS — Z01812 Encounter for preprocedural laboratory examination: Secondary | ICD-10-CM

## 2014-01-12 DIAGNOSIS — Z124 Encounter for screening for malignant neoplasm of cervix: Secondary | ICD-10-CM

## 2014-01-12 DIAGNOSIS — D251 Intramural leiomyoma of uterus: Secondary | ICD-10-CM | POA: Insufficient documentation

## 2014-01-12 DIAGNOSIS — Z1151 Encounter for screening for human papillomavirus (HPV): Secondary | ICD-10-CM

## 2014-01-12 DIAGNOSIS — G8918 Other acute postprocedural pain: Secondary | ICD-10-CM

## 2014-01-12 DIAGNOSIS — N920 Excessive and frequent menstruation with regular cycle: Secondary | ICD-10-CM

## 2014-01-12 LAB — POCT PREGNANCY, URINE
Preg Test, Ur: NEGATIVE
Preg Test, Ur: NEGATIVE

## 2014-01-12 MED ORDER — IBUPROFEN 200 MG PO TABS
600.0000 mg | ORAL_TABLET | Freq: Once | ORAL | Status: AC
  Administered 2014-01-12: 600 mg via ORAL

## 2014-01-12 NOTE — Progress Notes (Signed)
Patient ID: [name] [name], female   DOB: May 08, 1981, 33 y.o.   MRN: [id number] Patient's last menstrual period was 11/10/2013. Returns for pap and endometrial biopsy preparatory for ablation, needs to schedule. The procedure was explained.  Pap done  Patient given informed consent, signed copy in the chart, time out was performed. Appropriate time out taken. . The patient was placed in the lithotomy position and the cervix brought into view with sterile speculum.  Portio of cervix cleansed x 2 with betadine swabs.  A tenaculum was placed in the anterior lip of the cervix.  The uterus was sounded for depth of 10 cm. A pipelle was introduced to into the uterus, suction created,  and an endometrial sample was obtained. All equipment was removed and accounted for.  The patient tolerated the procedure well.    Patient given post procedure instructions. The patient will have endometrial ablation scheduled.  Woodroe Mode, MD 01/12/2014

## 2014-01-12 NOTE — Patient Instructions (Signed)
Endometrial Ablation Endometrial ablation removes the lining of the uterus (endometrium). It is usually a same-day, outpatient treatment. Ablation helps avoid major surgery, such as surgery to remove the cervix and uterus (hysterectomy). After endometrial ablation, you will have little or no menstrual bleeding and may not be able to have children. However, if you are premenopausal, you will need to use a reliable method of birth control following the procedure because of the small chance that pregnancy can occur. There are different reasons to have this procedure, which include:  Heavy periods.  Bleeding that is causing anemia.  Irregular bleeding.  Bleeding fibroids on the lining inside the uterus if they are smaller than 3 centimeters. This procedure should not be done if:  You want children in the future.  You have severe cramps with your menstrual period.  You have precancerous or cancerous cells in your uterus.  You were recently pregnant.  You have gone through menopause.  You have had major surgery on the uterus, such as a cesarean delivery. LET [facility] CARE PROVIDER KNOW ABOUT:  Any allergies you have.  All medicines you are taking, including vitamins, herbs, eye drops, creams, and over-the-counter medicines.  Previous problems you or members of your family have had with the use of anesthetics.  Any blood disorders you have.  Previous surgeries you have had.  Medical conditions you have. RISKS AND COMPLICATIONS  Generally, this is a safe procedure. However, as with any procedure, complications can occur. Possible complications include:  Perforation of the uterus.  Bleeding.  Infection of the uterus, bladder, or vagina.  Injury to surrounding organs.  An air bubble to the lung (air embolus).  Pregnancy following the procedure.  Failure of the procedure to help the problem, requiring hysterectomy.  Decreased ability to diagnose cancer in the lining of  the uterus. BEFORE THE PROCEDURE  The lining of the uterus must be tested to make sure there is no pre-cancerous or cancer cells present.  An ultrasound may be performed to look at the size of the uterus and to check for abnormalities.  Medicines may be given to thin the lining of the uterus. PROCEDURE  During the procedure, your health care provider will use a tool called a resectoscope to help see inside your uterus. There are different ways to remove the lining of your uterus.   Radiofrequency  This method uses a radiofrequency-alternating electric current to remove the lining of the uterus.  Cryotherapy This method uses extreme cold to freeze the lining of the uterus.  Heated-Free Liquid  This method uses heated salt (saline) solution to remove the lining of the uterus.  Microwave This method uses high-energy microwaves to heat up the lining of the uterus to remove it.  Thermal balloon  This method involves inserting a catheter with a balloon tip into the uterus. The balloon tip is filled with heated fluid to remove the lining of the uterus. AFTER THE PROCEDURE  After your procedure, do not have sexual intercourse or insert anything into your vagina until permitted by your health care provider. After the procedure, you may experience:  Cramps.  Vaginal discharge.  Frequent urination. Document Released: 07/12/2004 Document Revised: 05/05/2013 Document Reviewed: 02/03/2013 [facility] Patient Information 2014 [place]. Endometrial Biopsy, Care After Refer to this sheet in the next few weeks. These instructions provide you with information on caring for yourself after your procedure. Your health care provider may also give you more specific instructions. Your treatment has been planned according to  current medical practices, but problems sometimes occur. Call your health care provider if you have any problems or questions after your procedure. WHAT TO EXPECT AFTER THE  PROCEDURE After your procedure, it is typical to have the following:  You may have mild cramping and a small amount of vaginal bleeding for a few days after the procedure. This is normal. HOME CARE INSTRUCTIONS  Only take over-the-counter or prescription medicine as directed by your health care provider.  Do not douche, use tampons, or have sexual intercourse until your health care provider approves.  Follow your health care provider's instructions regarding any activity restrictions, such as strenuous exercise or heavy lifting. SEEK MEDICAL CARE IF:  You have heavy bleeding or bleeding longer than 2 days after the procedure.  You have bad smelling drainage from your vagina.  You have a fever and chills.  Youhave severe lower stomach (abdominal) pain. SEEK IMMEDIATE MEDICAL CARE IF:  You have severe cramps in your stomach or back.  You pass large blood clots.  Your bleeding increases.  You become weak or lightheaded, or you pass out. Document Released: 06/23/2013 Document Reviewed: 02/17/2013 [facility] Patient Information 2014 [place].

## 2014-01-13 ENCOUNTER — Encounter (HOSPITAL_COMMUNITY): Payer: Self-pay | Admitting: Pharmacist

## 2014-01-14 ENCOUNTER — Encounter (HOSPITAL_COMMUNITY)
Admission: RE | Disposition: A | Discharge status: Home / Self Care | Payer: Self-pay | Source: Ambulatory Visit | Attending: Obstetrics & Gynecology

## 2014-01-14 ENCOUNTER — Encounter (HOSPITAL_COMMUNITY): Payer: BC Managed Care – PPO

## 2014-01-14 ENCOUNTER — Ambulatory Visit (HOSPITAL_COMMUNITY)
Admission: RE | Admit: 2014-01-14 | Discharge: 2014-01-14 | Disposition: A | Discharge status: Home / Self Care | Payer: BC Managed Care – PPO | Source: Ambulatory Visit | Attending: Obstetrics & Gynecology | Admitting: Obstetrics & Gynecology

## 2014-01-14 ENCOUNTER — Encounter (HOSPITAL_COMMUNITY): Payer: Self-pay | Admitting: Anesthesiology

## 2014-01-14 ENCOUNTER — Ambulatory Visit (HOSPITAL_COMMUNITY): Payer: BC Managed Care – PPO

## 2014-01-14 DIAGNOSIS — N920 Excessive and frequent menstruation with regular cycle: Secondary | ICD-10-CM

## 2014-01-14 DIAGNOSIS — D649 Anemia, unspecified: Secondary | ICD-10-CM | POA: Insufficient documentation

## 2014-01-14 DIAGNOSIS — Z6841 Body Mass Index (BMI) 40.0 and over, adult: Secondary | ICD-10-CM | POA: Insufficient documentation

## 2014-01-14 DIAGNOSIS — R9389 Abnormal findings on diagnostic imaging of other specified body structures: Secondary | ICD-10-CM | POA: Insufficient documentation

## 2014-01-14 DIAGNOSIS — D252 Subserosal leiomyoma of uterus: Secondary | ICD-10-CM | POA: Insufficient documentation

## 2014-01-14 DIAGNOSIS — Z9851 Tubal ligation status: Secondary | ICD-10-CM | POA: Insufficient documentation

## 2014-01-14 DIAGNOSIS — D251 Intramural leiomyoma of uterus: Secondary | ICD-10-CM | POA: Insufficient documentation

## 2014-01-14 HISTORY — PX: NOVASURE ABLATION: SHX5394

## 2014-01-14 LAB — CBC
HEMATOCRIT: 30.4 % — AB (ref 36.0–46.0)
HEMOGLOBIN: 8.6 g/dL — AB (ref 12.0–15.0)
MCH: 19.8 pg — ABNORMAL LOW (ref 26.0–34.0)
MCHC: 28.3 g/dL — ABNORMAL LOW (ref 30.0–36.0)
MCV: 70 fL — AB (ref 78.0–100.0)
Platelets: 268 10*3/uL (ref 150–400)
RBC: 4.34 MIL/uL (ref 3.87–5.11)
RDW: 23.1 % — ABNORMAL HIGH (ref 11.5–15.5)
WBC: 5 10*3/uL (ref 4.0–10.5)

## 2014-01-14 SURGERY — NOVASURE ABLATION
Anesthesia: General | Site: Uterus

## 2014-01-14 MED ORDER — GLYCOPYRROLATE 0.2 MG/ML IJ SOLN
INTRAMUSCULAR | Status: DC | PRN
  Administered 2014-01-14: 0.2 mg via INTRAVENOUS

## 2014-01-14 MED ORDER — LIDOCAINE HCL (CARDIAC) 20 MG/ML IV SOLN
INTRAVENOUS | Status: AC
  Filled 2014-01-14: qty 5

## 2014-01-14 MED ORDER — ONDANSETRON HCL 4 MG/2ML IJ SOLN
INTRAMUSCULAR | Status: DC | PRN
  Administered 2014-01-14: 4 mg via INTRAVENOUS

## 2014-01-14 MED ORDER — ATROPINE SULFATE 0.4 MG/ML IJ SOLN
INTRAMUSCULAR | Status: AC
  Filled 2014-01-14: qty 1

## 2014-01-14 MED ORDER — ONDANSETRON HCL 4 MG/2ML IJ SOLN
INTRAMUSCULAR | Status: AC
  Filled 2014-01-14: qty 2

## 2014-01-14 MED ORDER — MIDAZOLAM HCL 2 MG/2ML IJ SOLN
INTRAMUSCULAR | Status: AC
  Filled 2014-01-14: qty 2

## 2014-01-14 MED ORDER — LIDOCAINE HCL (CARDIAC) 20 MG/ML IV SOLN
INTRAVENOUS | Status: DC | PRN
  Administered 2014-01-14: 30 mg via INTRAVENOUS

## 2014-01-14 MED ORDER — PROPOFOL 10 MG/ML IV EMUL
INTRAVENOUS | Status: AC
  Filled 2014-01-14: qty 20

## 2014-01-14 MED ORDER — LACTATED RINGERS IV SOLN
INTRAVENOUS | Status: DC
  Administered 2014-01-14 (×2): via INTRAVENOUS

## 2014-01-14 MED ORDER — KETOROLAC TROMETHAMINE 30 MG/ML IJ SOLN
INTRAMUSCULAR | Status: AC
  Filled 2014-01-14: qty 1

## 2014-01-14 MED ORDER — METOCLOPRAMIDE HCL 5 MG/ML IJ SOLN
10.0000 mg | Freq: Once | INTRAMUSCULAR | Status: DC | PRN
Start: 2014-01-14 — End: 2014-01-14

## 2014-01-14 MED ORDER — HYDROCODONE-ACETAMINOPHEN 5-325 MG PO TABS: 1.0000 | ORAL_TABLET | Freq: Four times a day (QID) | ORAL | Status: DC | PRN

## 2014-01-14 MED ORDER — MIDAZOLAM HCL 2 MG/2ML IJ SOLN
INTRAMUSCULAR | Status: DC | PRN
  Administered 2014-01-14: 2 mg via INTRAVENOUS

## 2014-01-14 MED ORDER — BUPIVACAINE-EPINEPHRINE (PF) 0.5% -1:200000 IJ SOLN
INTRAMUSCULAR | Status: AC
  Filled 2014-01-14: qty 30

## 2014-01-14 MED ORDER — FENTANYL CITRATE 0.05 MG/ML IJ SOLN
INTRAMUSCULAR | Status: AC
  Filled 2014-01-14: qty 2

## 2014-01-14 MED ORDER — DEXAMETHASONE SODIUM PHOSPHATE 10 MG/ML IJ SOLN
INTRAMUSCULAR | Status: AC
  Filled 2014-01-14: qty 1

## 2014-01-14 MED ORDER — DEXAMETHASONE SODIUM PHOSPHATE 10 MG/ML IJ SOLN
INTRAMUSCULAR | Status: DC | PRN
  Administered 2014-01-14: 10 mg via INTRAVENOUS

## 2014-01-14 MED ORDER — GLYCOPYRROLATE 0.2 MG/ML IJ SOLN
INTRAMUSCULAR | Status: AC
  Filled 2014-01-14: qty 1

## 2014-01-14 MED ORDER — KETOROLAC TROMETHAMINE 30 MG/ML IJ SOLN
INTRAMUSCULAR | Status: DC | PRN
  Administered 2014-01-14: 30 mg via INTRAVENOUS

## 2014-01-14 MED ORDER — FENTANYL CITRATE 0.05 MG/ML IJ SOLN: 25.0000 ug | INTRAMUSCULAR | Status: DC | PRN

## 2014-01-14 MED ORDER — BUPIVACAINE-EPINEPHRINE 0.5% -1:200000 IJ SOLN
INTRAMUSCULAR | Status: DC | PRN
  Administered 2014-01-14: 10 mL

## 2014-01-14 MED ORDER — FENTANYL CITRATE 0.05 MG/ML IJ SOLN
INTRAMUSCULAR | Status: DC | PRN
  Administered 2014-01-14 (×2): 50 ug via INTRAVENOUS

## 2014-01-14 MED ORDER — PROPOFOL 10 MG/ML IV EMUL
INTRAVENOUS | Status: DC | PRN
  Administered 2014-01-14: 200 mg via INTRAVENOUS

## 2014-01-14 MED ORDER — LACTATED RINGERS IV SOLN: INTRAVENOUS | Status: DC

## 2014-01-14 MED ORDER — MEPERIDINE HCL 25 MG/ML IJ SOLN: 6.2500 mg | INTRAMUSCULAR | Status: DC | PRN

## 2014-01-14 MED ORDER — ATROPINE SULFATE 0.4 MG/ML IJ SOLN
INTRAMUSCULAR | Status: DC | PRN
  Administered 2014-01-14: 0.2 mg via INTRAVENOUS

## 2014-01-14 SURGICAL SUPPLY — 13 items
ABLATOR ENDOMETRIAL BIPOLAR (ABLATOR) ×3 IMPLANT
CATH ROBINSON RED A/P 16FR (CATHETERS) ×3 IMPLANT
CLOTH BEACON ORANGE TIMEOUT ST (SAFETY) ×3 IMPLANT
CONTAINER PREFILL 10% NBF 60ML (FORM) ×6 IMPLANT
GLOVE BIO SURGEON STRL SZ 6.5 (GLOVE) ×2 IMPLANT
GLOVE BIO SURGEONS STRL SZ 6.5 (GLOVE) ×1
GLOVE BIOGEL PI IND STRL 7.0 (GLOVE) ×1 IMPLANT
GLOVE BIOGEL PI INDICATOR 7.0 (GLOVE) ×2
GLOVE SURG SS PI 7.0 STRL IVOR (GLOVE) ×18 IMPLANT
GOWN STRL REUS W/TWL LRG LVL3 (GOWN DISPOSABLE) ×6 IMPLANT
PACK VAGINAL MINOR WOMEN LF (CUSTOM PROCEDURE TRAY) ×3 IMPLANT
TOWEL OR 17X24 6PK STRL BLUE (TOWEL DISPOSABLE) ×6 IMPLANT
WATER STERILE IRR 1000ML POUR (IV SOLUTION) ×3 IMPLANT

## 2014-01-14 NOTE — Anesthesia Preprocedure Evaluation (Addendum)
Anesthesia Evaluation  Patient identified by MRN, date of birth, ID band Patient awake    Reviewed: Allergy & Precautions, H&P , NPO status , Patient's Chart, lab work & pertinent test results  Airway Mallampati: III TM Distance: >3 FB Neck ROM: Full    Dental  (+) Upper Dentures   Pulmonary neg pulmonary ROS,  breath sounds clear to auscultation  Pulmonary exam normal       Cardiovascular negative cardio ROS  Rhythm:Regular Rate:Normal     Neuro/Psych negative neurological ROS  negative psych ROS   GI/Hepatic negative GI ROS, Neg liver ROS,   Endo/Other  Morbid obesity  Renal/GU negative Renal ROS  negative genitourinary   Musculoskeletal negative musculoskeletal ROS (+)   Abdominal (+) + obese,   Peds  Hematology  (+) anemia ,   Anesthesia Other Findings   Reproductive/Obstetrics Menorrhagia Uterine fibroids                          Anesthesia Physical Anesthesia Plan  ASA: III  Anesthesia Plan: General   Post-op Pain Management:    Induction: Intravenous  Airway Management Planned: LMA  Additional Equipment:   Intra-op Plan:   Post-operative Plan: Extubation in OR  Informed Consent: I have reviewed the patients History and Physical, chart, labs and discussed the procedure including the risks, benefits and alternatives for the proposed anesthesia with the patient or authorized representative who has indicated his/her understanding and acceptance.   Dental advisory given  Plan Discussed with: Anesthesiologist, CRNA and Surgeon  Anesthesia Plan Comments:         Anesthesia Quick Evaluation

## 2014-01-14 NOTE — Anesthesia Postprocedure Evaluation (Signed)
  Anesthesia Post-op Note  Patient: [name] [name]  Procedure(s) Performed: Procedure(s): NOVASURE ABLATION (N/A)  Patient Location: PACU  Anesthesia Type:General  Level of Consciousness: awake, alert  and oriented  Airway and Oxygen Therapy: Patient Spontanous Breathing  Post-op Pain: mild  Post-op Assessment: Post-op Vital signs reviewed, Patient's Cardiovascular Status Stable, Respiratory Function Stable, Patent Airway, No signs of Nausea or vomiting and Pain level controlled  Post-op Vital Signs: Reviewed and stable  Last Vitals:  Filed Vitals:   01/14/14 1415  BP: 138/80  Pulse: 92  Temp:   Resp: 21    Complications: No apparent anesthesia complications

## 2014-01-14 NOTE — Brief Op Note (Signed)
01/14/2014  1:40 PM  PATIENT:  [name] [name]  33 y.o. female  PRE-OPERATIVE DIAGNOSIS:  MENORRHAGIA/UTERINE fibroids  POST-OPERATIVE DIAGNOSIS:  MENORRHAGIA/UTERINE fibroids  PROCEDURE:  Procedure(s): NOVASURE ABLATION (N/A)  SURGEON:  Surgeon(s) and Role:    * Devany Aja G Catia Todorov, MD - Primary  PHYSICIAN ASSISTANT: none  ASSISTANTS: none   ANESTHESIA:   general  EBL:  Total I/O In: 1000 [I.V.:1000] Out: 50 [Urine:50]  BLOOD ADMINISTERED:none  DRAINS: none   LOCAL MEDICATIONS USED:  MARCAINE     SPECIMEN:  No Specimen  DISPOSITION OF SPECIMEN:  N/A  COUNTS:  YES  TOURNIQUET:  * No tourniquets in log *  DICTATION: .Dragon Dictation  PLAN OF CARE: Discharge to home after PACU  PATIENT DISPOSITION:  PACU - hemodynamically stable.   Delay start of Pharmacological VTE agent (>24hrs) due to surgical blood loss or risk of bleeding: not applicable  Patient gave written consent for endometrial ablation with NovaSure due to menorrhagia. An endometrial biopsy was benign. Patient identification was confirmed and she was brought the operating room and general anesthesia was induced. She was placed in dorsal lithotomy position. Exam revealed normal-size uterus mid position no masses. Perineum and vagina were sterilely prepped and draped. The bladder was drained with red rubber catheter. Speculum was inserted and cervix was infiltrated with half percent Marcaine with 1 200,000 epinephrine for an intracervical block. Cervix was grasped with single-tooth tenaculum. Uterine cavity was sounded to 9 cm and cervical length was 4 cm giving a cavity length of 5 cm. Cervix was dilated sufficiently to pass the NovaSure device the device was deployed and the cavity width was set at 2.5 cm. Cavity assessment passed and normal treatment cycle lasting 75 seconds at 69 W was completed without difficulty. Device was removed intact. Allis meds were removed. Patient tolerated the procedure well without  complications and she was brought in stable condition to PACU.  Shaelynn Dragos G Berneice Zettlemoyer, MD 01/14/2014 1:45 PM 

## 2014-01-14 NOTE — Op Note (Signed)
01/14/2014  1:40 PM  PATIENT:  [name] [name]  33 y.o. female  PRE-OPERATIVE DIAGNOSIS:  MENORRHAGIA/UTERINE fibroids  POST-OPERATIVE DIAGNOSIS:  MENORRHAGIA/UTERINE fibroids  PROCEDURE:  Procedure(s): NOVASURE ABLATION (N/A)  SURGEON:  Surgeon(s) and Role:    * Woodroe Mode, MD - Primary  PHYSICIAN ASSISTANT: none  ASSISTANTS: none   ANESTHESIA:   general  EBL:  Total I/O In: 1000 [I.V.:1000] Out: 50 [Urine:50]  BLOOD ADMINISTERED:none  DRAINS: none   LOCAL MEDICATIONS USED:  MARCAINE     SPECIMEN:  No Specimen  DISPOSITION OF SPECIMEN:  N/A  COUNTS:  YES  TOURNIQUET:  * No tourniquets in log *  DICTATION: .Dragon Dictation  PLAN OF CARE: Discharge to home after PACU  PATIENT DISPOSITION:  PACU - hemodynamically stable.   Delay start of Pharmacological VTE agent (>24hrs) due to surgical blood loss or risk of bleeding: not applicable  Patient gave written consent for endometrial ablation with NovaSure due to menorrhagia. An endometrial biopsy was benign. Patient identification was confirmed and she was brought the operating room and general anesthesia was induced. She was placed in dorsal lithotomy position. Exam revealed normal-size uterus mid position no masses. Perineum and vagina were sterilely prepped and draped. The bladder was drained with red rubber catheter. Speculum was inserted and cervix was infiltrated with half percent Marcaine with 1 200,000 epinephrine for an intracervical block. Cervix was grasped with single-tooth tenaculum. Uterine cavity was sounded to 9 cm and cervical length was 4 cm giving a cavity length of 5 cm. Cervix was dilated sufficiently to pass the NovaSure device the device was deployed and the cavity width was set at 2.5 cm. Cavity assessment passed and normal treatment cycle lasting 75 seconds at 36 W was completed without difficulty. Device was removed intact. Allis meds were removed. Patient tolerated the procedure well without  complications and she was brought in stable condition to PACU.  Woodroe Mode, MD 01/14/2014 1:45 PM

## 2014-01-14 NOTE — Anesthesia Procedure Notes (Signed)
Procedure Name: LMA Insertion Date/Time: 01/14/2014 1:13 PM Performed by: Woodroe Mode Pre-anesthesia Checklist: Patient identified, Emergency Drugs available, Suction available and Patient being monitored Patient Re-evaluated:Patient Re-evaluated prior to inductionOxygen Delivery Method: Circle system utilized Preoxygenation: Pre-oxygenation with 100% oxygen Intubation Type: IV induction Ventilation: Mask ventilation without difficulty LMA: LMA inserted Number of attempts: 1 Dental Injury: Teeth and Oropharynx as per pre-operative assessment

## 2014-01-14 NOTE — H&P (Signed)
  [id number]  Patient's last menstrual period was 11/10/2013. Scheduled for endometrial ablation, benign endometrial biopsy confirmed H/o heavy menses for 4 days, regular, with period last month for more than 2 weeks, stopped with provera rx in [facility]. US showed intramural fibroids.  Past Surgical History   Procedure  Laterality  Date   .  Tubal ligation     .  Cesarean section      Past Medical History   Diagnosis  Date   .  Anemia    transfused 2010  Review of Systems  Constitutional: Negative.  Genitourinary: Positive for vaginal bleeding and menstrual problem. Negative for vaginal discharge and pelvic pain.  Objective:   Physical Exam  Vitals reviewed.  Constitutional: She appears well-developed. No distress.  Pulmonary/Chest: Effort normal. No respiratory distress.  Skin: Skin is warm and dry.  Psychiatric: She has a normal mood and affect. Her behavior is normal.  CLINICAL DATA: Abnormal bleeding. History of fibroids  EXAM:  TRANSABDOMINAL AND TRANSVAGINAL ULTRASOUND OF PELVIS  TECHNIQUE:  Both transabdominal and transvaginal ultrasound examinations of the  pelvis were performed. Transabdominal technique was performed for  global imaging of the pelvis including uterus, ovaries, adnexal  regions, and pelvic cul-de-sac. It was necessary to proceed with  endovaginal exam following the transabdominal exam to visualize the  endometrium.  COMPARISON: US PELVIS COMPLETE dated 04/22/2012  FINDINGS:  Uterus  Measurements: 11.3 x 6.5 x 6.6 cm. Several small fibroids. The  largest is a left fundal subserosal fibroid measuring 2.6 x 2.5 x  2.4 cm. Adjacent 1.8 cm and 2.3 cm intramural fibroids. Anterior  subserosal 2.3 cm fibroid.  Endometrium  Thickness: Thickened, measuring up to 21 mm.  Right ovary  Measurements: 3.8 x 2.3 x 1.9 cm. Probable small corpus luteum  cyst. Normal appearance/no adnexal mass.  Left ovary  Measurements: 4.3 x 2.0 x 2.4 cm. Normal appearance/no adnexal  mass.  Other findings  No free fluid.  IMPRESSION:  Multiple uterine fibroids as described above.  Thickened endometrium. If bleeding remains unresponsive to hormonal  or medical therapy, focal lesion work-up with sonohysterogram should  be considered. Endometrial biopsy should also be considered in  pre-menopausal patients at high risk for endometrial carcinoma.  (Ref: Radiological Reasoning: Algorithmic Workup of Abnormal Vaginal  Bleeding with Endovaginal Sonography and Sonohysterography. AJR  2008; [id number]:[id number])  Electronically Signed  By: Rolm Baptise M.D.  On: 12/07/2013 17:21  Assessment:   Menorrhagia, fiboids, s/p trichomonas, s/p BTL, candidate for endometrial ablation  Plan:   Provera daily, continue OTC Fe,    endometrial ablation today with Novasure. The risk of continued menstrual problems, bleeding, pelvic organ damage, pain, infection, anesthesia were discussed and questions answered 01/14/2014  Woodroe Mode, MD

## 2014-01-14 NOTE — Discharge Instructions (Signed)
Endometrial Ablation Endometrial ablation removes the lining of the uterus (endometrium). It is usually a same-day, outpatient treatment. Ablation helps avoid major surgery, such as surgery to remove the cervix and uterus (hysterectomy). After endometrial ablation, you will have little or no menstrual bleeding and may not be able to have children. However, if you are premenopausal, you will need to use a reliable method of birth control following the procedure because of the small chance that pregnancy can occur. There are different reasons to have this procedure, which include:  Heavy periods.  Bleeding that is causing anemia.  Irregular bleeding.  Bleeding fibroids on the lining inside the uterus if they are smaller than 3 centimeters. This procedure should not be done if:  You want children in the future.  You have severe cramps with your menstrual period.  You have precancerous or cancerous cells in your uterus.  You were recently pregnant.  You have gone through menopause.  You have had major surgery on the uterus, such as a cesarean delivery. LET [facility] CARE PROVIDER KNOW ABOUT:  Any allergies you have.  All medicines you are taking, including vitamins, herbs, eye drops, creams, and over-the-counter medicines.  Previous problems you or members of your family have had with the use of anesthetics.  Any blood disorders you have.  Previous surgeries you have had.  Medical conditions you have. RISKS AND COMPLICATIONS  Generally, this is a safe procedure. However, as with any procedure, complications can occur. Possible complications include:  Perforation of the uterus.  Bleeding.  Infection of the uterus, bladder, or vagina.  Injury to surrounding organs.  An air bubble to the lung (air embolus).  Pregnancy following the procedure.  Failure of the procedure to help the problem, requiring hysterectomy.  Decreased ability to diagnose cancer in the lining of  the uterus. BEFORE THE PROCEDURE  The lining of the uterus must be tested to make sure there is no pre-cancerous or cancer cells present.  An ultrasound may be performed to look at the size of the uterus and to check for abnormalities.  Medicines may be given to thin the lining of the uterus. PROCEDURE  During the procedure, your health care provider will use a tool called a resectoscope to help see inside your uterus. There are different ways to remove the lining of your uterus.   Radiofrequency  This method uses a radiofrequency-alternating electric current to remove the lining of the uterus.  Cryotherapy This method uses extreme cold to freeze the lining of the uterus.  Heated-Free Liquid  This method uses heated salt (saline) solution to remove the lining of the uterus.  Microwave This method uses high-energy microwaves to heat up the lining of the uterus to remove it.  Thermal balloon  This method involves inserting a catheter with a balloon tip into the uterus. The balloon tip is filled with heated fluid to remove the lining of the uterus. AFTER THE PROCEDURE  After your procedure, do not have sexual intercourse or insert anything into your vagina until permitted by your health care provider. After the procedure, you may experience:  Cramps.  Vaginal discharge.  Frequent urination. Document Released: 07/12/2004 Document Revised: 05/05/2013 Document Reviewed: 02/03/2013 [facility] Patient Information 2014 [place].  Post Anesthesia Home Care Instructions  Activity: Get plenty of rest for the remainder of the day. A responsible adult should stay with you for 24 hours following the procedure.  For the next 24 hours, DO NOT: -Drive a car Film/video editor -  Drink alcoholic beverages -Take any medication unless instructed by your physician -Make any legal decisions or sign important papers.  Meals: Start with liquid foods such as gelatin or soup. Progress to regular  foods as tolerated. Avoid greasy, spicy, heavy foods. If nausea and/or vomiting occur, drink only clear liquids until the nausea and/or vomiting subsides. Call your physician if vomiting continues.  Special Instructions/Symptoms: Your throat may feel dry or sore from the anesthesia or the breathing tube placed in your throat during surgery. If this causes discomfort, gargle with warm salt water. The discomfort should disappear within 24 hours.

## 2014-01-14 NOTE — Transfer of Care (Signed)
Immediate Anesthesia Transfer of Care Note  Patient: [name] [name]  Procedure(s) Performed: Procedure(s): NOVASURE ABLATION (N/A)  Patient Location: PACU  Anesthesia Type:General  Level of Consciousness: awake, alert  and oriented  Airway & Oxygen Therapy: Patient Spontanous Breathing and Patient connected to nasal cannula oxygen  Post-op Assessment: Report given to PACU RN and Post -op Vital signs reviewed and stable  Post vital signs: Reviewed and stable  Complications: No apparent anesthesia complications

## 2014-01-17 ENCOUNTER — Encounter (HOSPITAL_COMMUNITY): Payer: Self-pay | Admitting: Obstetrics & Gynecology

## 2014-01-17 ENCOUNTER — Other Ambulatory Visit: Payer: Self-pay | Admitting: Obstetrics & Gynecology

## 2014-02-10 ENCOUNTER — Encounter: Payer: Self-pay | Admitting: *Deleted

## 2014-07-03 IMAGING — US US TRANSVAGINAL NON-OB
1 series · 13 of 25 positions shown · non-contrast
Comparison: US PELVIS COMPLETE dated 04/22/2012

CLINICAL DATA: Abnormal bleeding.  History of fibroids



[Series 1: us pelvis complete · 13 of 74 slices shown]
[im 1/74]
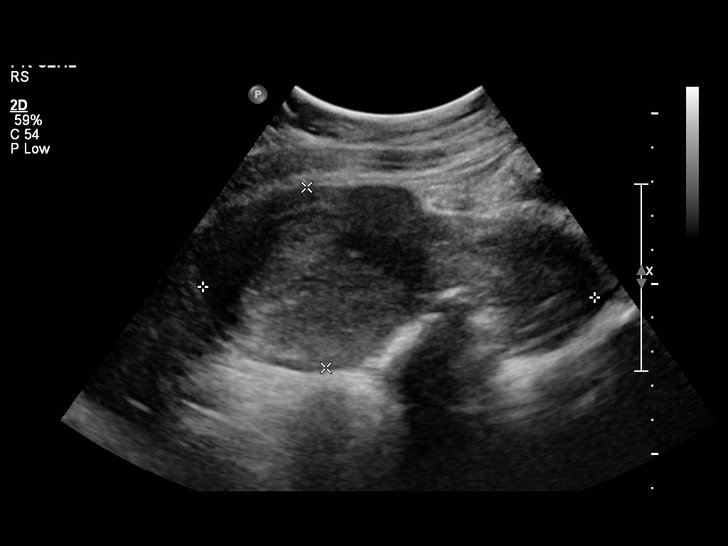
[im 7/74]
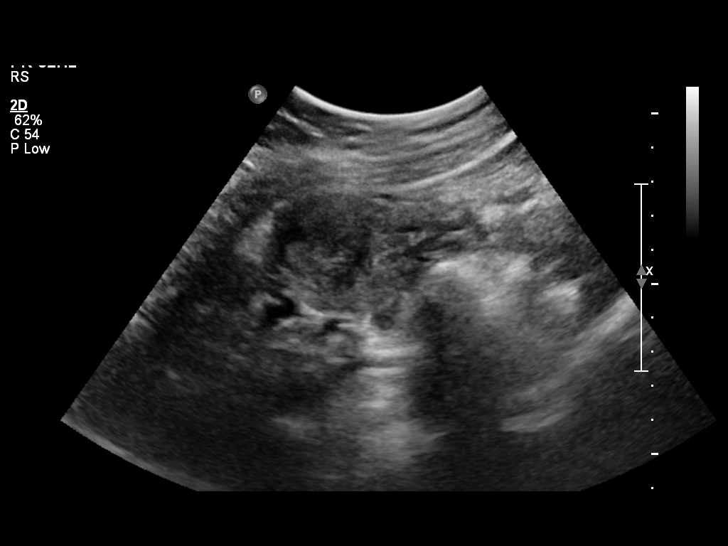
[im 13/74]
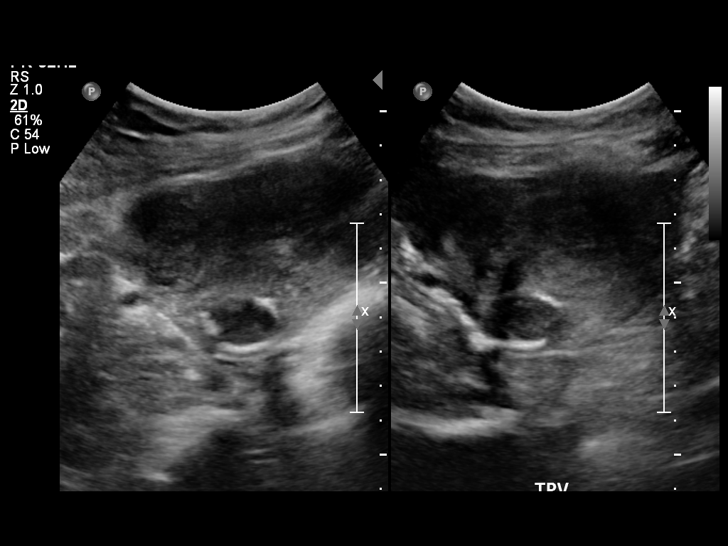
[im 19/74]
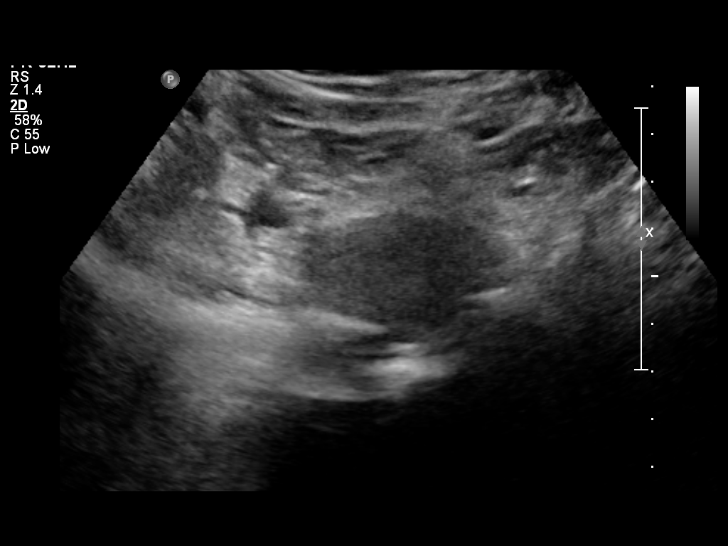
[im 25/74]
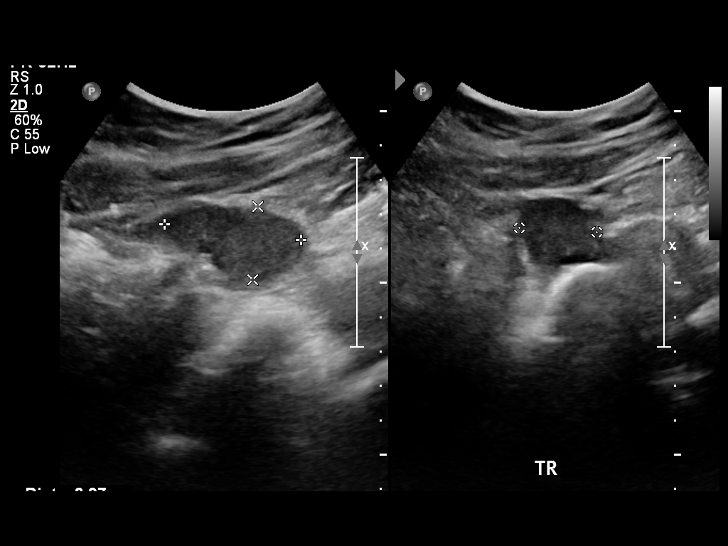
[im 31/74]
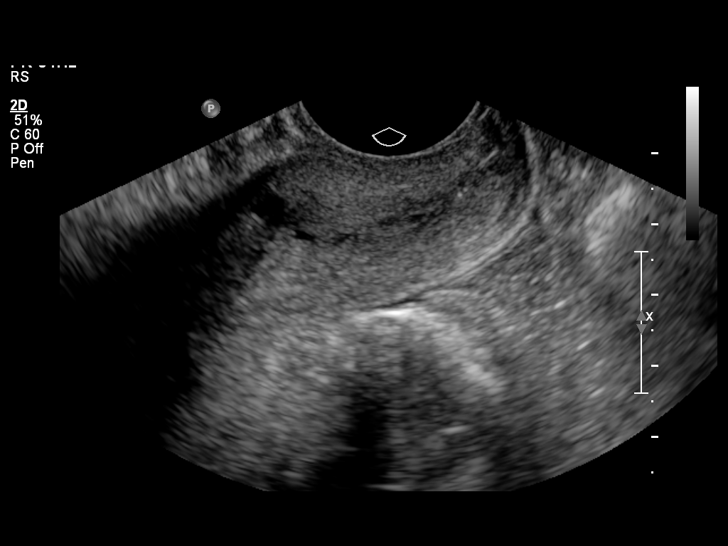
[im 37/74]
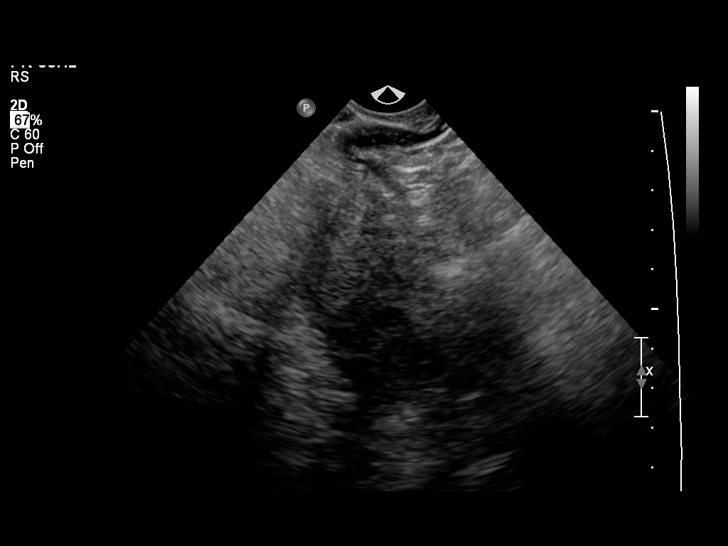
[im 43/74]
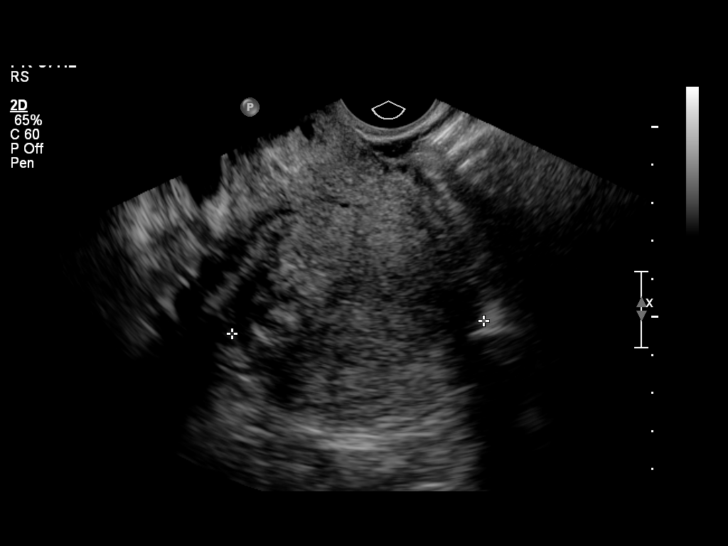
[im 49/74]
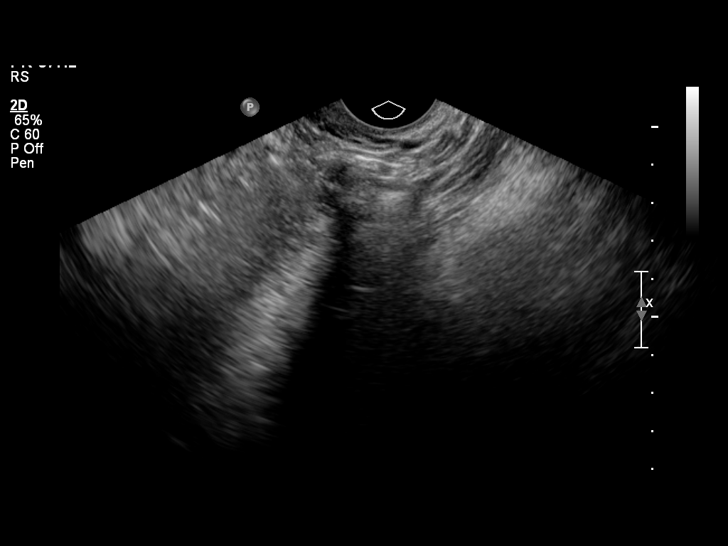
[im 55/74]
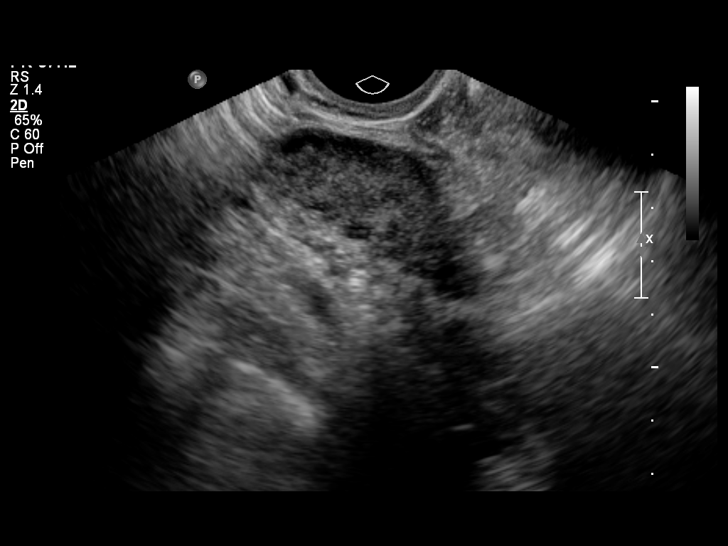
[im 61/74]
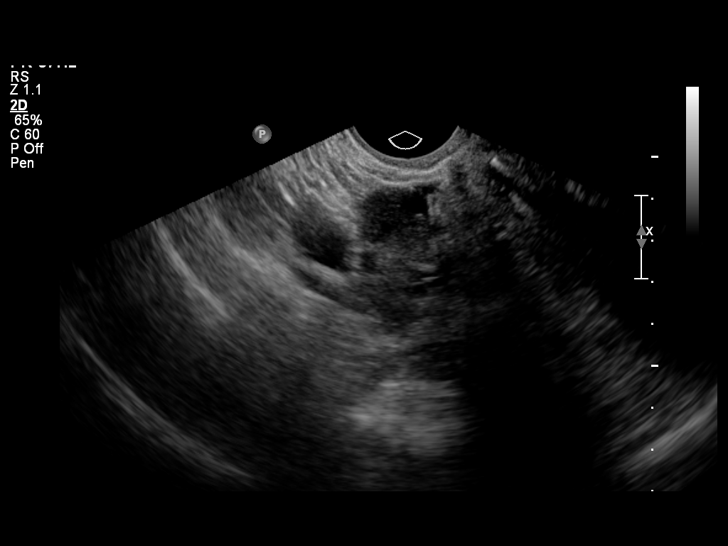
[im 67/74]
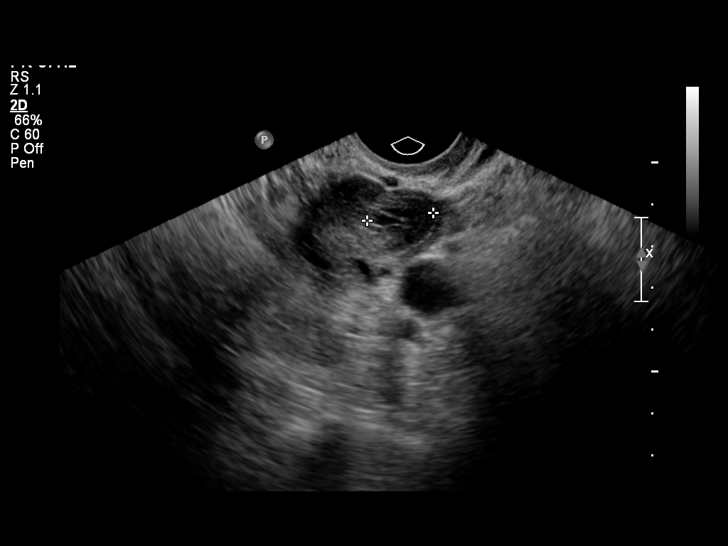
[im 74/74]
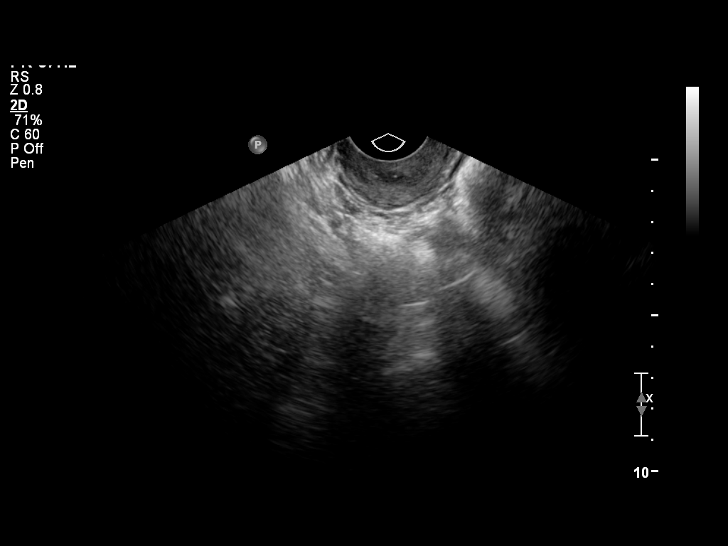

[13 of 25 positions shown; findings below may reference images not displayed]

FINDINGS: Uterus

Measurements: 11.3 x 6.5 x 6.6 cm. Several small fibroids. The
largest is a left fundal subserosal fibroid measuring 2.6 x 2.5 x
2.4 cm. Adjacent 1.8 cm and 2.3 cm intramural fibroids. Anterior
subserosal 2.3 cm fibroid.

Endometrium

Thickness: Thickened, measuring up to 21 mm.

Right ovary

Measurements: 3.8 x 2.3 x 1.9 cm. Probable small corpus luteum
cyst.. Normal appearance/no adnexal mass.

Left ovary

Measurements: 4.3 x 2.0 x 2.4 cm. Normal appearance/no adnexal mass.

Other findings

No free fluid.
IMPRESSION: Multiple uterine fibroids as described above.

Thickened endometrium. If bleeding remains unresponsive to hormonal
or medical therapy, focal lesion work-up with sonohysterogram should
be considered. Endometrial biopsy should also be considered in
pre-menopausal patients at high risk for endometrial carcinoma.
(Ref: Radiological Reasoning: Algorithmic Workup of Abnormal Vaginal
Bleeding with Endovaginal Sonography and Sonohysterography. AJR
[id number]; 191:[id number])

## 2014-07-18 ENCOUNTER — Encounter (HOSPITAL_COMMUNITY): Payer: Self-pay | Admitting: Obstetrics & Gynecology

## 2016-03-23 ENCOUNTER — Encounter (HOSPITAL_BASED_OUTPATIENT_CLINIC_OR_DEPARTMENT_OTHER): Payer: Self-pay | Admitting: *Deleted

## 2016-03-23 ENCOUNTER — Emergency Department (HOSPITAL_BASED_OUTPATIENT_CLINIC_OR_DEPARTMENT_OTHER)
Admission: EM | Admit: 2016-03-23 | Discharge: 2016-03-23 | Disposition: A | Discharge status: Home / Self Care | Payer: Self-pay | Attending: Emergency Medicine | Admitting: Emergency Medicine

## 2016-03-23 DIAGNOSIS — N898 Other specified noninflammatory disorders of vagina: Secondary | ICD-10-CM

## 2016-03-23 DIAGNOSIS — T7421XA Adult sexual abuse, confirmed, initial encounter: Secondary | ICD-10-CM | POA: Insufficient documentation

## 2016-03-23 DIAGNOSIS — N760 Acute vaginitis: Secondary | ICD-10-CM | POA: Insufficient documentation

## 2016-03-23 DIAGNOSIS — B9689 Other specified bacterial agents as the cause of diseases classified elsewhere: Secondary | ICD-10-CM

## 2016-03-23 LAB — URINALYSIS, ROUTINE W REFLEX MICROSCOPIC
BILIRUBIN URINE: NEGATIVE
Glucose, UA: NEGATIVE mg/dL
Hgb urine dipstick: NEGATIVE
Ketones, ur: 15 mg/dL — AB
NITRITE: NEGATIVE
PROTEIN: 30 mg/dL — AB
SPECIFIC GRAVITY, URINE: 1.025 (ref 1.005–1.030)
pH: 7 (ref 5.0–8.0)

## 2016-03-23 LAB — URINE MICROSCOPIC-ADD ON

## 2016-03-23 LAB — WET PREP, GENITAL
SPERM: NONE SEEN
Trich, Wet Prep: NONE SEEN
Yeast Wet Prep HPF POC: NONE SEEN

## 2016-03-23 LAB — PREGNANCY, URINE: PREG TEST UR: NEGATIVE

## 2016-03-23 MED ORDER — CEFTRIAXONE SODIUM 250 MG IJ SOLR
250.0000 mg | Freq: Once | INTRAMUSCULAR | Status: AC
  Administered 2016-03-23: 250 mg via INTRAMUSCULAR
  Filled 2016-03-23: qty 250

## 2016-03-23 MED ORDER — AZITHROMYCIN 250 MG PO TABS
ORAL_TABLET | ORAL | Status: AC
  Filled 2016-03-23: qty 4

## 2016-03-23 MED ORDER — AZITHROMYCIN 250 MG PO TABS
1000.0000 mg | ORAL_TABLET | Freq: Once | ORAL | Status: AC
  Administered 2016-03-23: 1000 mg via ORAL
  Filled 2016-03-23: qty 4

## 2016-03-23 MED ORDER — AZITHROMYCIN 1 G PO PACK: 1.0000 g | PACK | Freq: Once | ORAL | Status: DC

## 2016-03-23 MED ORDER — LIDOCAINE HCL (PF) 1 % IJ SOLN
INTRAMUSCULAR | Status: AC
Start: 2016-03-23 — End: 2016-03-23
  Administered 2016-03-23: 0.9 mL
  Filled 2016-03-23: qty 5

## 2016-03-23 MED ORDER — METRONIDAZOLE 500 MG PO TABS: 500.0000 mg | ORAL_TABLET | Freq: Two times a day (BID) | ORAL | Status: AC
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
## 2016-03-23 NOTE — Discharge Instructions (Signed)
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
## 2016-03-24 LAB — HIV ANTIBODY (ROUTINE TESTING W REFLEX): HIV Screen 4th Generation wRfx: NONREACTIVE

## 2016-03-24 LAB — RPR: RPR Ser Ql: NONREACTIVE

## 2016-03-25 LAB — HIV-1 RNA QUANT-NO REFLEX-BLD
HIV 1 RNA Quant: <20 copies/mL
LOG10 HIV-1 RNA: UNDETERMINED log10copy/mL

## 2016-03-25 LAB — GC/CHLAMYDIA PROBE AMP (~~LOC~~) NOT AT ARMC
Chlamydia: NEGATIVE
Neisseria Gonorrhea: NEGATIVE
